# Patient Record
Sex: Male | Born: 1995 | Race: White | Hispanic: No | Marital: Single
Health system: Midwestern US, Academic
[De-identification: ages and names within clinical notes are randomized; demographics above are authoritative.]

## PROBLEM LIST (undated history)

## (undated) DIAGNOSIS — F419 Anxiety disorder, unspecified: Secondary | ICD-10-CM

## (undated) HISTORY — DX: Anxiety disorder, unspecified: F41.9

---

## 2015-02-14 ENCOUNTER — Other Ambulatory Visit: Admit: 2015-02-14

## 2015-02-14 DIAGNOSIS — Z029 Encounter for administrative examinations, unspecified: Secondary | ICD-10-CM

## 2015-02-14 LAB — STUDENTHS HEMOGRAM AND 3 PART DIFF
Hematocrit: 50.3 % (ref 36.0–49.0)
Hemoglobin: 16.3 g/dL (ref 12.0–16.9)
Lymphocytes Absolute: 1399.2 /uL (ref 1200.0–5200.0)
Lymphocytes Relative: 26.4 % (ref 15.0–45.0)
MCH: 29.3 pg (ref 25.0–35.0)
MCHC: 32.5 g/dL (ref 31.0–36.0)
MCV: 90.2 fL (ref 78.0–98.0)
MPV: 8.7 fL (ref 7.5–11.5)
Monocytes Absolute: 217.3 /uL (ref 200.0–900.0)
Monocytes Relative: 4.1 % (ref 0.0–12.0)
Neutrophils Absolute: 3683.5 /uL (ref 1800.0–8000.0)
Neutrophils Relative: 69.5 % (ref 40.0–80.0)
Platelets: 200 10*3/uL (ref 140–400)
RBC: 5.58 10*6/uL (ref 4.10–5.70)
RDW: 12.5 % (ref 11.0–15.0)
WBC: 5.3 10*3/uL (ref 4.5–13.0)

## 2015-02-14 LAB — CK: Total CK: 147 U/L (ref 30–223)

## 2015-02-14 LAB — MYOGLOBIN, SERUM: Myoglobin: 22.2 ng/mL (ref 17.4–105.7)

## 2015-02-14 LAB — SICKLE CELL SCREEN RFX TO HGBE: Sickle Cell Screen: NEGATIVE

## 2015-02-14 LAB — CREATININE, SERUM
Creatinine: 1.11 mg/dL (ref 0.60–1.30)
eGFR AA CKD-EPI: >90 See note.
eGFR NONAA CKD-EPI: >90 See note.

## 2015-02-14 LAB — LACTATE DEHYDROGENASE: LD: 167 U/L (ref 110–270)

## 2015-04-17 ENCOUNTER — Ambulatory Visit: Admit: 2015-04-17 | Payer: PRIVATE HEALTH INSURANCE

## 2015-04-17 DIAGNOSIS — Z029 Encounter for administrative examinations, unspecified: Secondary | ICD-10-CM

## 2015-04-17 LAB — CREATININE, SERUM
Creatinine: 1.03 mg/dL (ref 0.60–1.30)
eGFR AA CKD-EPI: >90 See note.
eGFR NONAA CKD-EPI: >90 See note.

## 2015-04-17 LAB — MYOGLOBIN, SERUM: Myoglobin: 21.4 ng/mL (ref 17.4–105.7)

## 2015-04-17 LAB — CK: Total CK: 153 U/L (ref 30–223)

## 2015-04-17 LAB — TESTOSTERONE: Testosterone: 481 ng/dL (ref 175–781)

## 2015-04-17 LAB — CORTISOL: Cortisol (Immunoassay): 9.6 ug/dL

## 2015-04-17 LAB — ESTROGENS, TOTAL: Estrogen: 84 pg/mL (ref 40–115)

## 2015-04-17 LAB — LACTATE DEHYDROGENASE: LD: 202 U/L (ref 110–270)

## 2015-04-17 LAB — DHEA, UNCONJUGATED: DHEA: 150 ng/dL (ref 40–491)

## 2015-04-22 ENCOUNTER — Ambulatory Visit: Admit: 2015-04-23 | Payer: PRIVATE HEALTH INSURANCE

## 2015-04-22 DIAGNOSIS — Z029 Encounter for administrative examinations, unspecified: Secondary | ICD-10-CM

## 2015-04-23 LAB — CREATININE, SERUM
Creatinine: 1.31 mg/dL — ABNORMAL HIGH (ref 0.60–1.30)
eGFR AA CKD-EPI: >90 See note.
eGFR NONAA CKD-EPI: 79 See note.

## 2015-04-23 LAB — LACTATE DEHYDROGENASE: LD: 317 U/L — ABNORMAL HIGH (ref 110–270)

## 2015-04-23 LAB — MYOGLOBIN, SERUM: Myoglobin: 160.2 ng/mL — ABNORMAL HIGH (ref 17.4–105.7)

## 2015-04-23 LAB — CK: Total CK: 882 U/L (ref 30–223)

## 2015-04-28 ENCOUNTER — Ambulatory Visit: Admit: 2015-04-29 | Payer: PRIVATE HEALTH INSURANCE

## 2015-04-28 DIAGNOSIS — Z029 Encounter for administrative examinations, unspecified: Secondary | ICD-10-CM

## 2015-04-28 LAB — CREATININE, SERUM
Creatinine: 1.2 mg/dL (ref 0.60–1.30)
eGFR AA CKD-EPI: >90 See note.
eGFR NONAA CKD-EPI: 88 See note.

## 2015-04-28 LAB — DHEA, UNCONJUGATED: DHEA: 165 ng/dL (ref 40–491)

## 2015-04-28 LAB — CORTISOL: Cortisol: 13 ug/dL

## 2015-04-28 LAB — MYOGLOBIN, SERUM: Myoglobin: 186.7 ng/mL (ref 17.4–105.7)

## 2015-04-28 LAB — CK: Total CK: 1296 U/L (ref 30–223)

## 2015-04-28 LAB — ESTROGENS, TOTAL: Estrogen: 90 pg/mL (ref 40–115)

## 2015-04-28 LAB — LACTATE DEHYDROGENASE: LD: 334 U/L (ref 110–270)

## 2015-04-28 LAB — TESTOSTERONE: Testosterone: 108 ng/dL (ref 175–781)

## 2015-05-05 ENCOUNTER — Ambulatory Visit: Admit: 2015-05-05 | Payer: PRIVATE HEALTH INSURANCE

## 2015-05-05 DIAGNOSIS — Z029 Encounter for administrative examinations, unspecified: Secondary | ICD-10-CM

## 2015-05-05 LAB — DHEA, UNCONJUGATED: DHEA: 270 ng/dL (ref 40–491)

## 2015-05-05 LAB — MYOGLOBIN, SERUM: Myoglobin: 101.7 ng/mL (ref 17.4–105.7)

## 2015-05-05 LAB — LACTATE DEHYDROGENASE: LD: 266 U/L (ref 110–270)

## 2015-05-05 LAB — CK: Total CK: 465 U/L (ref 30–223)

## 2015-05-05 LAB — TESTOSTERONE: Testosterone: 247 ng/dL (ref 175–781)

## 2015-05-05 LAB — CREATININE, SERUM
Creatinine: 1.24 mg/dL (ref 0.60–1.30)
eGFR AA CKD-EPI: >90 See note.
eGFR NONAA CKD-EPI: 84 See note.

## 2015-05-05 LAB — CORTISOL: Cortisol: 15.3 ug/dL

## 2015-05-05 LAB — ESTROGENS, TOTAL: Estrogen: 118 pg/mL (ref 40–115)

## 2015-06-27 ENCOUNTER — Other Ambulatory Visit: Admit: 2015-06-27 | Payer: PRIVATE HEALTH INSURANCE

## 2015-06-27 DIAGNOSIS — J029 Acute pharyngitis, unspecified: Secondary | ICD-10-CM

## 2015-06-27 LAB — THROAT CULTURE: Culture Result: NORMAL

## 2015-06-27 LAB — EBV ANTIBODY IGM
EBV IGM NUM: 10 U/mL (ref 0.00–35.99)
EBV VCA IgM: NEGATIVE

## 2015-06-27 LAB — EPSTEIN-BARR VIRUS VCA IGG AB
EBV IGG NUM: 52.9 U/mL (ref 0.00–17.99)
EBV VCA IgG: POSITIVE

## 2015-06-27 LAB — STUDENTHS RAPID STREP A CULT IF NEG: Rapid Strep A Screen: NEGATIVE

## 2015-07-05 ENCOUNTER — Other Ambulatory Visit: Admit: 2015-07-05 | Payer: PRIVATE HEALTH INSURANCE

## 2015-07-05 DIAGNOSIS — Z029 Encounter for administrative examinations, unspecified: Secondary | ICD-10-CM

## 2015-07-05 LAB — LACTATE DEHYDROGENASE: LD: 207 U/L (ref 110–270)

## 2015-07-05 LAB — TESTOSTERONE: Testosterone: 290 ng/dL (ref 175–781)

## 2015-07-05 LAB — CK: Total CK: 325 U/L — ABNORMAL HIGH (ref 30–223)

## 2015-07-05 LAB — CORTISOL: Cortisol: 6.4 ug/dL

## 2015-07-05 LAB — ESTROGENS, TOTAL: Estrogen: 82 pg/mL (ref 40–115)

## 2015-07-05 LAB — MYOGLOBIN, SERUM: Myoglobin: 50.5 ng/mL (ref 17.4–105.7)

## 2015-07-05 LAB — CREATININE, SERUM
Creatinine: 1.16 mg/dL (ref 0.60–1.30)
eGFR AA CKD-EPI: >90 See note.
eGFR NONAA CKD-EPI: >90 See note.

## 2015-07-05 LAB — DHEA, UNCONJUGATED: DHEA: 184 ng/dL (ref 40–491)

## 2015-12-05 ENCOUNTER — Inpatient Hospital Stay: Admit: 2015-12-05 | Payer: PRIVATE HEALTH INSURANCE

## 2015-12-05 DIAGNOSIS — M898X7 Other specified disorders of bone, ankle and foot: Secondary | ICD-10-CM

## 2015-12-10 ENCOUNTER — Ambulatory Visit: Admit: 2015-12-10 | Payer: PRIVATE HEALTH INSURANCE | Attending: Sports Medicine

## 2015-12-10 ENCOUNTER — Other Ambulatory Visit: Admit: 2015-12-10 | Payer: PRIVATE HEALTH INSURANCE

## 2015-12-10 DIAGNOSIS — S060X1A Concussion with loss of consciousness of 30 minutes or less, initial encounter: Secondary | ICD-10-CM

## 2015-12-10 DIAGNOSIS — Z029 Encounter for administrative examinations, unspecified: Secondary | ICD-10-CM

## 2015-12-10 LAB — CORTISOL: Cortisol: 6.9 ug/dL

## 2015-12-10 NOTE — Unmapped (Signed)
Per Trina Ao:    12/09/15 Lee Snethen    Met in training room at 11:00 AM.  [employer] and [employer] in attendance.     About noon on 12/08/15 (near he end of practice) Was making tackle and  hit back of head on turf. Remembers going fast into the play, didn't  remember play specifics until saw it on film. Reports after watching  scrimmage on tape, he remembered everything.     Received red glasses from [employer]. Reports relief with  glasses, mainly on the ride over here. Chose to wear during our visit  when prompted.     Reports after leaving yesterday, went home, laid down, and hydrated.  Claims did not have appetite but was able to eat chicken and potatoes  last night, pancake this morning. Claims appetite is still neutral  now.   Note, after exam claimed appetite returned - was hungry.     Did the Omega wave in the training room before seeing [place]. Will repeat  Omega wave as well as float today or tomorrow prn [place] [employer] and Nadine Counts.     Reports neck is sore on side of impact. Has not received neck therapy  today.  identified point tenderness right side posterior. had right  head tilt during dynavision today.     Reports has watched the film of scrimmage on the iPad without issue;  when looking on phone for a minute noticed his head a  return/exacerbate.     He has class 12:20 to 2. Monday; see recommendations for tomorrow.    Exam Action Plan, post QnA Dynavision, Retilab, VEP with color and  nystagomagraphy.       Dynavision   A* 80 hits per minute     Rx 0.36 seconds.   .28 to 0.39  39% slower   .26 to 0.39  50% slower  Substantially slowed peripheral vision reaction time.       Mem1 100 hits, no errors   Mem2 89 hits, no errors. Head tilt to right. Reports it makes his  head hurt more, though is manageable   Mem3 82 hits; no errors  This is a substantial fall in HPM, consistent with difficulties  multitasking and engaging complex executive function.     Retilab   Baseline below   B/w   R 67.5...102.2...19.4   L  71.6...108.6...19.3   Small   R 82.2...105.1...20.2   L 83.4...114.5.Marland KitchenMarland Kitchen19.0   End baseline     12/09/15 exam   B/w   R 75.1...103.3.Marland KitchenMarland Kitchen25.6   L 76.9.Marland KitchenMarland Kitchen103.9.Marland KitchenMarland Kitchen24.6   Small   R 84.5...114.5.Marland KitchenMarland Kitchen30.7   L 84.5...115.7....23.8     Green   R 70.5.Marland KitchenMarland Kitchen104.5.Marland KitchenMarland Kitchen20.5   L 78.7.Marland KitchenMarland Kitchen103.9.Marland KitchenMarland Kitchen22.8   Small   R 84.0...111.5...23.2   L 82.8...110.4.Marland KitchenMarland Kitchen24.5     Blue   R 77.5...111.5.Marland KitchenMarland Kitchen19.5   L 78.7...108.0.Marland KitchenMarland Kitchen18.0   Small   R 91.6...120.4.Marland KitchenMarland Kitchen8.23   L 96.3...125.0...11.1     Red   R 76.9...112.1...17.8   L 75.7...106.8.Marland KitchenMarland Kitchen20.9   Small   R 88.6...117.7.Marland KitchenMarland Kitchen14.9   L 86.3...114.5...18.4    increased voltages, highest voltages with color green; consistent with  red glasses relief.     Nyst   R 144.6   L 132.7   Ratio 109%    Baseline Nystagmus  R. 95.32  L. 105.00  End Baseline    substantially slowed nystagmography results.    Pupils perrla - with photophobia/photosensitivity.    -blue...felt brighter than red...no    -green & red about the same...likes  red more      Dynavision  ???head tilted about 20 degrees to the right  ???Delayed  or slowed performance       Recommendations   Seeing [place] at 10:30 tomorrow morning for return to learn.    Follow nystagmography and retilab results concerning return to learn.     Eat well balanced diet. Even if not feeling hungry, try to eat some things     Drink plenty of water     Keep media to a minimum. Only 1 hour up close. TV okay if greater than 40ft.    Work with Archivist for neck, float, omega wave, Halo prn.    Notify appropriate personnel concerning academics.    give consideration to assessing phorias, OCT and saccades for return to learn.

## 2015-12-10 NOTE — Unmapped (Signed)
Chief Complaint: New Head injury    HEAD INJURY HPI:   PCP: Lilyan Punt, MD  Date of Injury: 12/08/15  School/Sport:[employer]   He is a 20 y.o. male who is here for a new head injury.    He does have dazed and confused amnesia, with LOC of a few seconds  Current PCSS Symptom Total = 11 - not quantified    PMH:  Previous head injuries suffered are 1 occuring in middle school. Patient was diagnosed with none prior to the injury.  and has the following habits contributing to prolonged concussion recovery:  no contributing habits and family history unknown    History reviewed. No pertinent past medical history.  No current outpatient prescriptions on file.     No current facility-administered medications for this visit.     No Known Allergies  History reviewed. No pertinent family history.    Review OF Systems  HEENT: Denies recent epistaxis or sore throat.   CV: Denies any recent peripheral edema,   RESP: Denies any recent cough, hemoptysis, dyspnea, DOE, or change in exercise tolerance.   GI: Denies any recent nausea, vomiting, diarrhea, constipation, indigestion or dysphagia.   SKIN: Denies any new nodules, rash, itching, or hives.   GU: Denies any recent hematuria,   MS: Denies any new or other areas of myalgias, joint stiffness or joint swelling.   PSYCH: Denies any recent abnormal anxiety or depression.   NEURO:  Denies any recent changes in localized sensation, strength or coordination  ENDO:  Denies heat/cold intolerances, or polydipsia.    EXAM  General Appearance: well appearing  Psych: Alert, cooperative, no distress, appears stated age; Engaging, makes eye contact, appropriate affect & behavior  Head: Normocephalic  without obvious abnormality,      Eyes: Appropriate pupil response to light bilaterally; conjunctiva/corneas clear, EOM's intact, convergence is normal; saccadic motion is slow to the right  no anti-saccades;  no phorias  Pain with left - right movement - slight rolling vertical nystabmus  Ears:   Normal canals, TM and no w/d to tunning fork test  Neck: Supple, symmetrical, trachea midline, no adenopathy;  thyroid:No enlargement/tenderness/nodules;  Cervical Spine: No midline tenderness, spasms and - Spurling's test   + bilateral A-O tendereness  Lungs: Normal breath sounds without extra, abnormal sounds    Heart: Regular rate and rhythm, normal cardio-inspiratory reflex   Extremities Extremities normal, atraumatic, moves all in coordinated manner. No worsening of headache with isometric contractions  Skin: Skin color, texture, turgor normal, no rashes or lesions   Neurologic: CNII-XII intact.  Normal strength, sensation and reflexes   Coordinated upper extremity gross motions are normal.   Fine motor coordination appears symmetrical and right (dominant over left on several hand approximations)  Balance:  >3 errors on single- leg and tandem stance.   Computerized Neuropsych testing not done    Assessment:    He is a 20 y.o. male with recent concussion - had LOC symptom load of #17- decreased ot #11   Previous head injuries: 1 occuring in middle school.     Plan  RTP Stage: can progress to low intensity activity  Meds: ibuprofen tid  Referrals: none  F/U: 3-days        Federico Flake, M.D.

## 2015-12-14 NOTE — Unmapped (Signed)
Per Trina Ao    December 12 2015; Ibraheem Pigue in [facility] training room.     Claims he Feels better.  Classes are going good.  Does exercises with [place] [employer] or Marcelino Duster   Wears glasses. Not on now. Squinting. Claims he wears them when exercising and when outside.   Gaylyn Rong is main co. Still Feels slow.    Dynavision   84 a star.  Rx test. Not remembering instructions. .32  .31.  .31  .33.  .28    Mem 3.   90.    4 exo at 20 feet.  0 vertical phobia.  Far  Near 4 exo     Balance good.  Possly small pronator drift.    Sw Nadine Counts and [place].     Plan.  Check phoria baselines. Compare to ours.  Stay the course with progression.  Cardio low impact.  Dynavision rehab. Multi tasking, frontal lobe.   See again Friday or sooner prn.  Re return to learn, continue as before. Next test 1.5 weeks. Consider added time.

## 2015-12-15 MED ORDER — methylPREDNISolone (MEDROL, PAK,) 4 mg tablet
4 | ORAL_TABLET | ORAL | Status: AC
Start: 2015-12-15 — End: 2016-05-11

## 2015-12-18 ENCOUNTER — Other Ambulatory Visit: Admit: 2015-12-18 | Payer: PRIVATE HEALTH INSURANCE

## 2015-12-18 DIAGNOSIS — S060X1D Concussion with loss of consciousness of 30 minutes or less, subsequent encounter: Secondary | ICD-10-CM

## 2015-12-18 LAB — CORTISOL
Cortisol: 5.2 ug/dL
Cortisol: 10.6 ug/dL

## 2015-12-19 NOTE — Unmapped (Signed)
Per Trina Ao    Gryffin Conaty Fu exam. April 3. 2017.     Progressing. Claims he feels normal. Stay the course. Continue exertion cardio with trainers. Continue classes.  Work progression and monitor sx.   Fu with Dr. Ellin Goodie and training staff. Give consideration to using extra time on next exam. Report if time needed or if Sx Occurred. Verbal report given to [facility].

## 2016-03-26 ENCOUNTER — Other Ambulatory Visit: Admit: 2016-03-26 | Payer: PRIVATE HEALTH INSURANCE

## 2016-03-26 DIAGNOSIS — R5383 Other fatigue: Secondary | ICD-10-CM

## 2016-03-26 LAB — CBC
Hematocrit: 44.6 % (ref 38.5–50.0)
Hemoglobin: 15.2 g/dL (ref 13.2–17.1)
MCH: 30.7 pg (ref 27.0–33.0)
MCHC: 33.9 g/dL (ref 32.0–36.0)
MCV: 90.3 fL (ref 80.0–100.0)
MPV: 8.9 fL (ref 7.5–11.5)
Platelets: 210 10*3/uL (ref 140–400)
RBC: 4.94 10*6/uL (ref 4.20–5.80)
RDW: 13.1 % (ref 11.0–15.0)
WBC: 7.2 10*3/uL (ref 3.8–10.8)

## 2016-03-26 LAB — COMPREHENSIVE METABOLIC PANEL
ALT: 32 U/L (ref 7–52)
AST: 38 U/L (ref 13–39)
Albumin: 4.5 g/dL (ref 3.5–5.7)
Alkaline Phosphatase: 73 U/L (ref 36–125)
Anion Gap: 10 mmol/L (ref 3–16)
BUN: 25 mg/dL (ref 7–25)
CO2: 28 mmol/L (ref 21–33)
Calcium: 9.2 mg/dL (ref 8.6–10.3)
Chloride: 102 mmol/L (ref 98–110)
Creatinine: 1.31 mg/dL (ref 0.60–1.30)
Glucose: 120 mg/dL (ref 70–100)
Osmolality, Calculated: 296 mOsm/kg (ref 278–305)
Potassium: 4 mmol/L (ref 3.5–5.3)
Sodium: 140 mmol/L (ref 133–146)
Total Bilirubin: 0.6 mg/dL (ref 0.0–1.5)
Total Protein: 7 g/dL (ref 6.4–8.9)
eGFR AA CKD-EPI: >90 See note.
eGFR NONAA CKD-EPI: 78 See note.

## 2016-03-26 LAB — CORTISOL: Cortisol: 10.5 ug/dL

## 2016-03-26 LAB — VITAMIN D 25 HYDROXY: Vit D, 25-Hydroxy: 52.8 ng/mL (ref 30.0–100)

## 2016-03-26 LAB — THYROID FUNCTION CASCADE: TSH: 1.72 u[IU]/mL (ref 0.34–5.60)

## 2016-03-26 LAB — TESTOSTERONE: Testosterone: 267 ng/dL (ref 175–781)

## 2016-03-28 ENCOUNTER — Other Ambulatory Visit: Admit: 2016-03-28 | Payer: PRIVATE HEALTH INSURANCE

## 2016-03-28 DIAGNOSIS — Z029 Encounter for administrative examinations, unspecified: Secondary | ICD-10-CM

## 2016-03-28 LAB — TESTOSTERONE: Testosterone: 192 ng/dL (ref 175–781)

## 2016-03-28 LAB — IRON STUDIES
% Iron Saturation: 30.2 % (ref 15.0–55.0)
Iron: 125 ug/dL (ref 50–212)
TIBC: 414 ug/dL (ref 261–462)

## 2016-03-28 LAB — CREATININE, SERUM
Creatinine: 1.37 mg/dL (ref 0.60–1.30)
eGFR AA CKD-EPI: 86 See note.
eGFR NONAA CKD-EPI: 74 See note.

## 2016-03-28 LAB — FERRITIN: Ferritin: 65.2 ng/mL (ref 23.9–336.2)

## 2016-03-28 LAB — LACTATE DEHYDROGENASE: LD: 237 U/L (ref 110–270)

## 2016-03-28 LAB — ESTROGENS, TOTAL: Estrogen: 65 pg/mL (ref 40–115)

## 2016-03-28 LAB — CK: Total CK: 702 U/L (ref 30–223)

## 2016-03-28 LAB — MYOGLOBIN, SERUM: Myoglobin: 129.8 ng/mL (ref 17.4–105.7)

## 2016-03-28 LAB — CORTISOL: Cortisol: 20.8 ug/dL

## 2016-03-28 LAB — DHEA, UNCONJUGATED: DHEA: 208 ng/dL (ref 40–491)

## 2016-04-10 ENCOUNTER — Other Ambulatory Visit: Admit: 2016-04-10 | Payer: PRIVATE HEALTH INSURANCE

## 2016-04-10 DIAGNOSIS — Z029 Encounter for administrative examinations, unspecified: Secondary | ICD-10-CM

## 2016-04-10 LAB — CREATININE, SERUM
Creatinine: 1.08 mg/dL (ref 0.60–1.30)
eGFR AA CKD-EPI: >90 See note.
eGFR NONAA CKD-EPI: >90 See note.

## 2016-04-10 LAB — CORTISOL: Cortisol: 21.1 ug/dL

## 2016-04-10 LAB — TESTOSTERONE: Testosterone: 451 ng/dL (ref 175–781)

## 2016-04-10 LAB — IRON STUDIES
% Iron Saturation: 22.9 % (ref 15.0–55.0)
Iron: 92 ug/dL (ref 50–212)
TIBC: 402 ug/dL (ref 261–462)

## 2016-04-10 LAB — FERRITIN: Ferritin: 56.2 ng/mL (ref 23.9–336.2)

## 2016-04-10 LAB — LACTATE DEHYDROGENASE: LD: 258 U/L (ref 110–270)

## 2016-04-10 LAB — CK: Total CK: 401 U/L (ref 30–223)

## 2016-04-10 LAB — ESTROGENS, TOTAL: Estrogen: 105 pg/mL (ref 40–115)

## 2016-04-10 LAB — DHEA, UNCONJUGATED: DHEA: 413 ng/dL (ref 40–491)

## 2016-04-10 LAB — MYOGLOBIN, SERUM: Myoglobin: 38.4 ng/mL (ref 17.4–105.7)

## 2016-04-16 ENCOUNTER — Other Ambulatory Visit: Admit: 2016-04-16 | Payer: PRIVATE HEALTH INSURANCE

## 2016-04-16 DIAGNOSIS — Z029 Encounter for administrative examinations, unspecified: Secondary | ICD-10-CM

## 2016-04-16 LAB — CREATININE, SERUM
Creatinine: 1.26 mg/dL (ref 0.60–1.30)
eGFR AA CKD-EPI: >90 See note.
eGFR NONAA CKD-EPI: 82 See note.

## 2016-04-16 LAB — TESTOSTERONE: Testosterone: 485 ng/dL (ref 175–781)

## 2016-04-16 LAB — LACTATE DEHYDROGENASE: LD: 276 U/L (ref 110–270)

## 2016-04-16 LAB — MYOGLOBIN, SERUM: Myoglobin: 93.2 ng/mL (ref 17.4–105.7)

## 2016-04-16 LAB — IRON STUDIES
% Iron Saturation: 26.5 % (ref 15.0–55.0)
Iron: 105 ug/dL (ref 50–212)
TIBC: 396 ug/dL (ref 261–462)

## 2016-04-16 LAB — CORTISOL: Cortisol: 8 ug/dL

## 2016-04-16 LAB — CK: Total CK: 705 U/L (ref 30–223)

## 2016-04-16 LAB — FERRITIN: Ferritin: 53.8 ng/mL (ref 23.9–336.2)

## 2016-04-16 LAB — DHEA, UNCONJUGATED: DHEA: 152 ng/dL (ref 40–491)

## 2016-04-16 LAB — ESTROGENS, TOTAL: Estrogen: 114 pg/mL (ref 40–115)

## 2016-04-23 ENCOUNTER — Ambulatory Visit: Admit: 2016-04-23 | Payer: PRIVATE HEALTH INSURANCE

## 2016-04-23 DIAGNOSIS — Z029 Encounter for administrative examinations, unspecified: Secondary | ICD-10-CM

## 2016-04-23 LAB — CREATININE, SERUM
Creatinine: 1.18 mg/dL (ref 0.60–1.30)
eGFR AA CKD-EPI: >90 See note.
eGFR NONAA CKD-EPI: 89 See note.

## 2016-04-23 LAB — CK: Total CK: 773 U/L — ABNORMAL HIGH (ref 30–223)

## 2016-04-23 LAB — CORTISOL: Cortisol (Immunoassay): 19.2 ug/dL

## 2016-04-23 LAB — LACTATE DEHYDROGENASE: LD: 259 U/L (ref 110–270)

## 2016-04-23 LAB — IRON STUDIES
% Iron Saturation: 27.9 % (ref 15.0–55.0)
Iron: 105 ug/dL (ref 50–212)
TIBC: 377 ug/dL (ref 261–462)

## 2016-04-23 LAB — DHEA, UNCONJUGATED: DHEA: 344 ng/dL (ref 40–491)

## 2016-04-23 LAB — MYOGLOBIN, SERUM: Myoglobin: 44 ng/mL (ref 17.4–105.7)

## 2016-04-23 LAB — ESTROGENS, TOTAL: Estrogen: 122 pg/mL (ref 40–115)

## 2016-04-23 LAB — TESTOSTERONE: Testosterone: 400 ng/dL (ref 175–781)

## 2016-04-23 LAB — FERRITIN: Ferritin: 55.4 ng/mL (ref 23.9–336.2)

## 2016-04-29 ENCOUNTER — Other Ambulatory Visit: Admit: 2016-04-30 | Payer: PRIVATE HEALTH INSURANCE

## 2016-04-29 DIAGNOSIS — Z029 Encounter for administrative examinations, unspecified: Secondary | ICD-10-CM

## 2016-04-30 LAB — CREATININE, SERUM
Creatinine: 1.25 mg/dL (ref 0.60–1.30)
eGFR AA CKD-EPI: >90 See note.
eGFR NONAA CKD-EPI: 83 See note.

## 2016-04-30 LAB — ESTROGENS, TOTAL: Estrogen: 126 pg/mL — ABNORMAL HIGH (ref 40–115)

## 2016-04-30 LAB — CK: Total CK: 671 U/L (ref 30–223)

## 2016-04-30 LAB — FERRITIN: Ferritin: 66.7 ng/mL (ref 23.9–336.2)

## 2016-04-30 LAB — DHEA, UNCONJUGATED: DHEA: 146 ng/dL (ref 40–491)

## 2016-04-30 LAB — MYOGLOBIN, SERUM: Myoglobin: 79.4 ng/mL (ref 17.4–105.7)

## 2016-04-30 LAB — LACTATE DEHYDROGENASE: LD: 278 U/L (ref 110–270)

## 2016-04-30 LAB — IRON STUDIES
% Iron Saturation: 27.2 % (ref 15.0–55.0)
Iron: 105 ug/dL (ref 50–212)
TIBC: 386 ug/dL (ref 261–462)

## 2016-04-30 LAB — TESTOSTERONE: Testosterone: 257 ng/dL (ref 175–781)

## 2016-04-30 LAB — CORTISOL: Cortisol: 6.2 ug/dL

## 2016-05-04 ENCOUNTER — Ambulatory Visit: Admit: 2016-05-04 | Payer: PRIVATE HEALTH INSURANCE

## 2016-05-04 DIAGNOSIS — Z029 Encounter for administrative examinations, unspecified: Secondary | ICD-10-CM

## 2016-05-04 LAB — LACTATE DEHYDROGENASE: LD: 263 U/L (ref 110–270)

## 2016-05-04 LAB — TESTOSTERONE: Testosterone: 387 ng/dL (ref 175–781)

## 2016-05-04 LAB — CREATININE, SERUM
Creatinine: 1.04 mg/dL (ref 0.60–1.30)
eGFR AA CKD-EPI: >90 See note.
eGFR NONAA CKD-EPI: >90 See note.

## 2016-05-04 LAB — IRON STUDIES
% Iron Saturation: 17.1 % (ref 15.0–55.0)
Iron: 66 ug/dL (ref 50–212)
TIBC: 385 ug/dL (ref 261–462)

## 2016-05-04 LAB — FERRITIN: Ferritin: 78.5 ng/mL (ref 23.9–336.2)

## 2016-05-04 LAB — CK: Total CK: 365 U/L (ref 30–223)

## 2016-05-04 LAB — DHEA, UNCONJUGATED: DHEA: 218 ng/dL (ref 40–491)

## 2016-05-04 LAB — CORTISOL: Cortisol: 14.3 ug/dL

## 2016-05-04 LAB — MYOGLOBIN, SERUM: Myoglobin: 47 ng/mL (ref 17.4–105.7)

## 2016-05-04 LAB — ESTROGENS, TOTAL: Estrogen: 119 pg/mL — ABNORMAL HIGH (ref 40–115)

## 2016-05-11 MED ORDER — clindamycin (CLEOCIN) 300 MG capsule
300 | ORAL_CAPSULE | Freq: Four times a day (QID) | ORAL | Status: AC
Start: 2016-05-11 — End: 2018-10-02

## 2016-05-11 NOTE — Unmapped (Signed)
Skin infection right thumb at IP joint    Clinicamycin, soak cover and I/D this week    [name] Kristie Bracewell

## 2017-07-26 MED ORDER — doxycycline (VIBRAMYCIN) 100 MG capsule
100 | ORAL_CAPSULE | Freq: Two times a day (BID) | ORAL | 0 refills | Status: AC
Start: 2017-07-26 — End: 2017-08-05

## 2017-07-26 NOTE — Unmapped (Signed)
HPI  Pt. Having new URi symptoms ofnasal congestion and low grade fever for > 7 days; Has a dry cough w/o chest pain or dyspnea. Mild headaches Difficulty sleeping due to nasal congestion     No history of reactive airway disease history    ROS: No significant GI symptoms or skin rash    Exam-  Fever < 100  ENT - ears, normal appearing TM and canals,           - nasal turbinate swelling          - red throat w/o exudate  Neck/Nodes - shotty nodes w/o significant tenderness  Chest- clear  Skin- no obvious rash or patterned rash    Imp-  Sinus infection > 10 days of symtpoms    Plan-  Antibiotics were  prescribed  Pseudophed/guaif 60/400 1-2 BID prn  Afrin nasal spray 2-3 x/daily prn for 3 days max  Ibuprofen or Tylenol prn  Increase fluid intake   OK to work-out provided no fever or symptoms below the neck    Eric Miranda Eric Miranda

## 2017-12-22 ENCOUNTER — Ambulatory Visit (INDEPENDENT_AMBULATORY_CARE_PROVIDER_SITE_OTHER): Payer: BLUE CROSS/BLUE SHIELD | Admitting: Family Medicine

## 2017-12-22 ENCOUNTER — Other Ambulatory Visit: Payer: Self-pay | Admitting: Family Medicine

## 2017-12-22 ENCOUNTER — Encounter: Payer: Self-pay | Admitting: Family Medicine

## 2017-12-22 VITALS — BP 152/63 | HR 73 | Temp 98.2°F | Resp 14

## 2017-12-22 DIAGNOSIS — F4323 Adjustment disorder with mixed anxiety and depressed mood: Secondary | ICD-10-CM

## 2017-12-22 NOTE — Progress Notes (Signed)
Patient presents today with symptoms of anxiety and depression. Patient states that he had symptoms related to anxiety prior to coming to [place]. These anxiety issues typically centered around football performance. He is a Psychologist, forensicjunior transfer student. His symptoms were associated with playing football at the [employer]. In December 2018 the patient decided to [place] [employer] [place] and come to [place]. He made this decision because he wasn't getting any significant playing time and putting in all the hard work. His dream has always been to play football at the [employer]. He states that his decision to come to [place] [facility] [employer] was quick and impulsive. He admits that he has had some adjustment issues with being in a smaller town and around a less diverse Proofreaderstudent body. He does enjoy the healthy football atmosphere at [place]. He does believe that he has put quite a bit of pressure on himself to perform better since he is a transfer from a bigger school. Recently the patient went home for Spring Break and saw a lot of his friends. He missed being in [place]. When he returned to [facility] he noticed himself becoming more introverted and not wanting to hang out with people here. He also has been thinking about the numerous credits he has to still do to graduate given that he transferred. He had one episode last week where he felt like he wanted to pack up his things and go back home. He denies having any crying spells. Patient states that he has other interests now besides football. He denies any suicidal or homicidal ideations. He denies any drug use. He has taken medication for anxiety in the past for short periods of time. He is currently not taking any medication. He admits to meeting with a counselor on-campus yesterday. He states that his appointment went well and has a follow-up appointment next week. He is interested in establishing with a psychiatrist in the area.  ROS: Negative except  mentioned above. Vitals as per Epic.  GENERAL: NAD, good eye contact  RESP: CTA B CARD: RRR NEURO: CN II-XII grossly intact   A/P: Adjustment disorder with anxiety and depression symptoms  - would recommend that patient continue to see counselor on campus, will also refer patient to Dr. Maryruth BunKapur (psychiatrist), if patient has a crisis he is to seek medical attention as discussed, patient addressed understanding of plan and was appreciative. He had no further questions.

## 2018-05-07 ENCOUNTER — Emergency Department: Payer: BLUE CROSS/BLUE SHIELD

## 2018-05-07 ENCOUNTER — Other Ambulatory Visit: Payer: Self-pay

## 2018-05-07 ENCOUNTER — Emergency Department
Admission: EM | Admit: 2018-05-07 | Discharge: 2018-05-07 | Disposition: A | Discharge status: Home / Self Care | Payer: BLUE CROSS/BLUE SHIELD | Attending: Emergency Medicine | Admitting: Emergency Medicine

## 2018-05-07 DIAGNOSIS — M25511 Pain in right shoulder: Secondary | ICD-10-CM | POA: Diagnosis not present

## 2018-05-07 DIAGNOSIS — R079 Chest pain, unspecified: Secondary | ICD-10-CM

## 2018-05-07 DIAGNOSIS — N289 Disorder of kidney and ureter, unspecified: Secondary | ICD-10-CM

## 2018-05-07 DIAGNOSIS — N189 Chronic kidney disease, unspecified: Secondary | ICD-10-CM | POA: Insufficient documentation

## 2018-05-07 LAB — CBC
HEMATOCRIT: 45 % (ref 40.0–52.0)
HEMOGLOBIN: 16 g/dL (ref 13.0–18.0)
MCH: 31.5 pg (ref 26.0–34.0)
MCHC: 35.5 g/dL (ref 32.0–36.0)
MCV: 88.8 fL (ref 80.0–100.0)
Platelets: 193 10*3/uL (ref 150–440)
RBC: 5.07 MIL/uL (ref 4.40–5.90)
RDW: 12.4 % (ref 11.5–14.5)
WBC: 8.2 10*3/uL (ref 3.8–10.6)

## 2018-05-07 LAB — BASIC METABOLIC PANEL
ANION GAP: 8 (ref 5–15)
BUN: 21 mg/dL — AB (ref 6–20)
CHLORIDE: 102 mmol/L (ref 98–111)
CO2: 27 mmol/L (ref 22–32)
Calcium: 9 mg/dL (ref 8.9–10.3)
Creatinine, Ser: 1.57 mg/dL — ABNORMAL HIGH (ref 0.61–1.24)
GFR calc Af Amer: >60 mL/min (ref >60)
GLUCOSE: 112 mg/dL — AB (ref 70–99)
POTASSIUM: 3.6 mmol/L (ref 3.5–5.1)
Sodium: 137 mmol/L (ref 135–145)

## 2018-05-07 LAB — TROPONIN I: Troponin I: <0.03 ng/mL (ref <0.03)

## 2018-05-07 LAB — FIBRIN DERIVATIVES D-DIMER (ARMC ONLY): Fibrin derivatives D-dimer (ARMC): 187.68 ng/mL (FEU) (ref 0.00–499.00)

## 2018-05-07 MED ORDER — IBUPROFEN 600 MG PO TABS
600.0000 mg | ORAL_TABLET | Freq: Once | ORAL | Status: AC
  Administered 2018-05-07: 600 mg via ORAL
  Filled 2018-05-07: qty 1

## 2018-05-07 MED ORDER — IBUPROFEN 800 MG PO TABS: 800.0000 mg | ORAL_TABLET | Freq: Three times a day (TID) | ORAL | 0 refills | Status: AC | PRN

## 2018-05-07 MED ORDER — SODIUM CHLORIDE 0.9 % IV BOLUS
1000.0000 mL | Freq: Once | INTRAVENOUS | Status: AC
  Administered 2018-05-07: 1000 mL via INTRAVENOUS

## 2018-05-07 MED ORDER — ACETAMINOPHEN 500 MG PO TABS
1000.0000 mg | ORAL_TABLET | Freq: Once | ORAL | Status: AC
  Administered 2018-05-07: 1000 mg via ORAL
  Filled 2018-05-07: qty 2

## 2018-05-07 NOTE — ED Notes (Signed)
Pt stated that he woke up this morning with right side pain when he woke up. Pt stated that he went and worked up and he become dizzy but none at present time. Pt denies any injury to site.

## 2018-05-07 NOTE — Discharge Instructions (Addendum)
Today your kidney function was slightly abnormal.  This is likely due to some mild dehydration so please drink plenty of fluid and have your primary care physician recheck your kidney function on Monday.  You may take Tylenol or Motrin for pain.  You may also use a heating pad for 15 minutes every 2 hours to decrease pain.    Return to the emergency department if you develop severe pain, lightheadedness or fainting, fever, or any other symptoms concerning to you.

## 2018-05-07 NOTE — ED Provider Notes (Signed)
[facility] Emergency Department Provider Note  ____________________________________________  Time seen: Approximately 11:24 PM  I have reviewed the triage vital signs and the nursing notes.   HISTORY  Chief Complaint Rib Pain    HPI Mario Stuart is a 22 y.o. male, otherwise healthy, presenting with right-sided chest pain.  The patient reports that this morning he awoke and had a pain over the right chest which radiated to the right shoulder.  He denies any recent trauma or new strain.  His pain was worse with deep breaths or exertion at football practice, and he felt short of breath at practice.  He has not had any lightheadedness or syncope, shortness of breath.  He has not tried anything for his pain.  Past Medical History:  Diagnosis Date  . Anxiety     There are no active problems to display for this patient.   History reviewed. No pertinent surgical history.  Current Outpatient Rx  . Order #: [id number] Class: Normal    Allergies Patient has no known allergies.  Family History  Problem Relation Age of Onset  . Hypertension Father     Social History Social History   Tobacco Use  . Smoking status: Never Smoker  . Smokeless tobacco: Former Engineer, water Use Topics  . Alcohol use: Yes  . Drug use: Not on file    Review of Systems Constitutional: No fever/chills.  No lightheadedness or syncope.  No trauma Eyes: No visual changes. ENT: No sore throat. No congestion or rhinorrhea. Cardiovascular: Positive chest pain. Denies palpitations. Respiratory: Positive shortness of breath.  No cough. Gastrointestinal: No abdominal pain.  No nausea, no vomiting.  No diarrhea.  No constipation. Genitourinary: Negative for dysuria. Musculoskeletal: Negative for back pain. Skin: Negative for rash. Neurological: Negative for headaches. No focal numbness, tingling or weakness.     ____________________________________________   PHYSICAL  EXAM:  VITAL SIGNS: ED Triage Vitals [05/07/18 1802]  Enc Vitals Group     BP 121/81     Pulse Rate (!) 106     Resp (!) 22     Temp 99.6 F (37.6 C)     Temp Source Oral     SpO2 100 %     Weight 222 lb (100.7 kg)     Height 6\' 2"  (1.88 m)     Head Circumference      Peak Flow      Pain Score 7     Pain Loc      Pain Edu?      Excl. in GC?     Constitutional: Alert and oriented. Answers questions appropriately. Eyes: Conjunctivae are normal.  EOMI. No scleral icterus. Head: Atraumatic. Nose: No congestion/rhinnorhea. Mouth/Throat: Mucous membranes are moist.  Neck: No stridor.  Supple.  No JVD.  No meningismus. CHEST: Patient has no evidence of skin abnormalities including ecchymosis or swelling, erythema, on the chest wall.  He has no crepitus.  No palpable rib instability. Cardiovascular: Normal rate, regular rhythm. No murmurs, rubs or gallops.  Respiratory: Normal respiratory effort.  No accessory muscle use or retractions. Lungs CTAB.  No wheezes, rales or ronchi. Gastrointestinal: Soft, nontender and nondistended.  No guarding or rebound.  No peritoneal signs. Musculoskeletal: Full range of motion of the right wrist, elbow and shoulder without difficulty.  Normal right radial pulse.  No LE edema. No ttp in the calves or palpable cords.  Negative Homan's sign. Neurologic:  A&Ox3.  Speech is clear.  Face and smile are symmetric.  EOMI.  Moves all extremities well. Skin:  Skin is warm, dry and intact. No rash noted. Psychiatric: Mood and affect are normal. Speech and behavior are normal.  Normal judgement.  ____________________________________________   LABS (all labs ordered are listed, but only abnormal results are displayed)  Labs Reviewed  BASIC METABOLIC PANEL - Abnormal; Notable for the following components:      Result Value   Glucose, Bld 112 (*)    BUN 21 (*)    Creatinine, Ser 1.57 (*)    All other components within normal limits  CBC  TROPONIN I   FIBRIN DERIVATIVES D-DIMER ([facility] ONLY)   ____________________________________________  EKG  ED ECG REPORT I, Anne-Caroline Sharma Covert, the attending physician, personally viewed and interpreted this ECG.   Date: 05/07/2018  EKG Time: 1802  Rate: 108  Rhythm: sinus tachycardia  Axis: normal  Intervals:none  ST&T Change: No STEMI; no evidence of Brugada syndrome, hypertrophy, or prolonged QTC.  ____________________________________________  RADIOLOGY  Dg Chest 2 View  Result Date: 05/07/2018 CLINICAL DATA:  22 y/o M; L woke with pain from the right ribs through the right shoulder. EXAM: CHEST - 2 VIEW COMPARISON:  None. FINDINGS: The heart size and mediastinal contours are within normal limits. Both lungs are clear. The visualized skeletal structures are unremarkable. IMPRESSION: No active cardiopulmonary disease. Electronically Signed   By: Mitzi Hansen M.D.   On: 05/07/2018 18:27    ____________________________________________   PROCEDURES  Procedure(s) performed: None  Procedures  Critical Care performed: No ____________________________________________   INITIAL IMPRESSION / ASSESSMENT AND PLAN / ED COURSE  Pertinent labs & imaging results that were available during my care of the patient were reviewed by me and considered in my medical decision making (see chart for details).  22 y.o. male, otherwise healthy, presenting with right-sided chest pain that is pleuritic and radiates to the right shoulder.  Overall, I do not see any evidence of trauma and he has no history of this.  He also is not giving any history that would suppose a infectious process such as pneumonia.  He has no evidence of DVT, no family or personal history of blood clots, no tachycardia no hypoxia so he is very low risk for PE.  However, he is describing pleuritic chest pain so we have had a long discussion with the patient and both of his parents regarding the use of d-dimer versus CT  imaging.  They have opted to get a d-dimer and if negative, the patient will be discharged home with instructions for symptom medic treatment with Tylenol or Motrin, as well as heat therapy, and follow-up on Monday with his primary care physician.  ----------------------------------------- 11:29 PM on 05/07/2018 -----------------------------------------  The patient does have some mild renal insufficiency today with a creatinine of 1.57.  He has received a liter of intravenous fluids and I have let him know about this result.  He will follow-up with his primary care physician on Monday and has been encouraged to drink plenty of fluid over the weekend.  The patient's d-dimer is negative and he will be discharged at this time.  ____________________________________________  FINAL CLINICAL IMPRESSION(S) / ED DIAGNOSES  Final diagnoses:  Renal insufficiency  Right-sided chest pain  Acute pain of right shoulder         NEW MEDICATIONS STARTED DURING THIS VISIT:  New Prescriptions   IBUPROFEN (ADVIL,MOTRIN) 800 MG TABLET    Take 1 tablet (800 mg total) by mouth every 8 (eight) hours as needed for  mild pain or moderate pain (with food).      Rockne MenghiniNorman, Anne-Caroline, MD 05/07/18 2329

## 2018-05-07 NOTE — ED Triage Notes (Signed)
Awoke with pain from right ribs up through right shoulder. No known injury. Pain with deep breathing.  Pt alert and oriented X4, active, cooperative, pt in NAD. RR even and unlabored, color WNL.

## 2018-05-10 ENCOUNTER — Other Ambulatory Visit: Payer: Self-pay | Admitting: Family Medicine

## 2018-05-10 ENCOUNTER — Other Ambulatory Visit (INDEPENDENT_AMBULATORY_CARE_PROVIDER_SITE_OTHER): Payer: BLUE CROSS/BLUE SHIELD | Admitting: Family Medicine

## 2018-05-10 ENCOUNTER — Other Ambulatory Visit: Payer: Self-pay

## 2018-05-10 VITALS — HR 78 | Resp 14

## 2018-05-10 DIAGNOSIS — R1011 Right upper quadrant pain: Secondary | ICD-10-CM

## 2018-05-10 DIAGNOSIS — J029 Acute pharyngitis, unspecified: Secondary | ICD-10-CM

## 2018-05-10 NOTE — Addendum Note (Signed)
Addended by: Berneice GandyBROWN, Leani Myron A on: 05/10/2018 12:43 PM   Modules accepted: Orders

## 2018-05-10 NOTE — Progress Notes (Signed)
Patient presents today after being seen in the ER for right upper quadrant abdominal pain radiating to the right shoulder. The etiology of his symptoms was not discovered in the ER but a d-dimer was negative and a chest x-ray was read as normal. Patient's creatinine was slightly elevated however patient has been playing football daily. He was encouraged to hydrate and to follow-up with me.Patient states that his symptoms have decreased some since being seen in the ER. He now has a minimal sore throat and had one day where he felt like he had a fever. He also has experienced some decreased appetite. He denies any constipation, diarrhea, vomiting, severe headache, urinary symptoms, rash. Patient denies taking any new medications recently. He has not had to take Tylenol or Ibuprofen for any discomfort in the last 24 hours. He states the best position is when he is lying down. He denies any alcohol or illicit drug use.  ROS: Negative except mentioned above. Vitals as per Epic. GENERAL: NAD HEENT: mild pharyngeal erythema, no exudate, no erythema of TMs, no cervical LAD RESP: CTA B CARD: RRR ABD: positive bowel sounds, mild right upper quadrant tenderness, no rebound or guarding, no flank tenderness, no organomegaly appreciated on exam NEURO: CN II-XII grossly intact   A/P: Right upper quadrant abdominal pain, sore throat -liver function tests were not done in the ER, a CMP and CBC were drawn along with EBV titers. A throat culture was also done today. We will order abdominal ultrasound given patient's symptoms. We'll discuss further plan of care after labs and imaging has been reviewed. Seek medical attention if any acute problems. No athletic activity for now.

## 2018-05-11 ENCOUNTER — Ambulatory Visit
Admission: RE | Admit: 2018-05-11 | Discharge: 2018-05-11 | Disposition: A | Discharge status: Home / Self Care | Payer: BLUE CROSS/BLUE SHIELD | Source: Ambulatory Visit | Attending: Family Medicine | Admitting: Family Medicine

## 2018-05-11 DIAGNOSIS — R1011 Right upper quadrant pain: Secondary | ICD-10-CM | POA: Diagnosis not present

## 2018-05-13 ENCOUNTER — Other Ambulatory Visit: Payer: Self-pay | Admitting: Family Medicine

## 2018-05-13 ENCOUNTER — Ambulatory Visit (INDEPENDENT_AMBULATORY_CARE_PROVIDER_SITE_OTHER): Payer: BLUE CROSS/BLUE SHIELD | Admitting: Gastroenterology

## 2018-05-13 ENCOUNTER — Encounter: Payer: Self-pay | Admitting: Gastroenterology

## 2018-05-13 VITALS — BP 107/74 | HR 86 | Ht 74.0 in | Wt 217.4 lb

## 2018-05-13 DIAGNOSIS — R1010 Upper abdominal pain, unspecified: Secondary | ICD-10-CM | POA: Diagnosis not present

## 2018-05-13 LAB — CBC WITH DIFFERENTIAL/PLATELET
BASOS: 0 %
Basophils Absolute: 0 10*3/uL (ref 0.0–0.2)
EOS (ABSOLUTE): 0 10*3/uL (ref 0.0–0.4)
EOS: 1 %
HEMOGLOBIN: 15.9 g/dL (ref 13.0–17.7)
Hematocrit: 44.9 % (ref 37.5–51.0)
IMMATURE GRANS (ABS): 0 10*3/uL (ref 0.0–0.1)
Immature Granulocytes: 0 %
LYMPHS: 27 %
Lymphocytes Absolute: 1.1 10*3/uL (ref 0.7–3.1)
MCH: 32 pg (ref 26.6–33.0)
MCHC: 35.4 g/dL (ref 31.5–35.7)
MCV: 90 fL (ref 79–97)
Monocytes Absolute: 0.6 10*3/uL (ref 0.1–0.9)
Monocytes: 13 %
NEUTROS ABS: 2.5 10*3/uL (ref 1.4–7.0)
Neutrophils: 59 %
PLATELETS: 168 10*3/uL (ref 150–450)
RBC: 4.97 x10E6/uL (ref 4.14–5.80)
RDW: 12.8 % (ref 12.3–15.4)
WBC: 4.2 10*3/uL (ref 3.4–10.8)

## 2018-05-13 LAB — COMPREHENSIVE METABOLIC PANEL
ALK PHOS: 84 IU/L (ref 39–117)
ALT: 21 IU/L (ref 0–44)
AST: 17 IU/L (ref 0–40)
Albumin/Globulin Ratio: 1.6 (ref 1.2–2.2)
Albumin: 4.3 g/dL (ref 3.5–5.5)
BUN/Creatinine Ratio: 11 (ref 9–20)
BUN: 13 mg/dL (ref 6–20)
Bilirubin Total: 0.5 mg/dL (ref 0.0–1.2)
CALCIUM: 9.4 mg/dL (ref 8.7–10.2)
CO2: 26 mmol/L (ref 20–29)
Chloride: 101 mmol/L (ref 96–106)
Creatinine, Ser: 1.18 mg/dL (ref 0.76–1.27)
GFR calc Af Amer: 101 mL/min/{1.73_m2} (ref >59)
GFR calc non Af Amer: 88 mL/min/{1.73_m2} (ref >59)
GLUCOSE: 123 mg/dL — AB (ref 65–99)
Globulin, Total: 2.7 g/dL (ref 1.5–4.5)
Potassium: 4 mmol/L (ref 3.5–5.2)
SODIUM: 141 mmol/L (ref 134–144)
Total Protein: 7 g/dL (ref 6.0–8.5)

## 2018-05-13 LAB — EPSTEIN-BARR VIRUS VCA ANTIBODY PANEL
EBV EARLY ANTIGEN AB, IGG: 10.8 U/mL — AB (ref 0.0–8.9)
EBV NA IGG: 70.8 U/mL — AB (ref 0.0–17.9)
EBV VCA IGG: 56.3 U/mL — AB (ref 0.0–17.9)

## 2018-05-13 LAB — LIPASE: Lipase: 13 U/L (ref 13–78)

## 2018-05-13 LAB — CULTURE, GROUP A STREP

## 2018-05-13 LAB — AMYLASE: Amylase: 51 U/L (ref 31–124)

## 2018-05-13 MED ORDER — LIDOCAINE 5 % EX PTCH: 1.0000 | MEDICATED_PATCH | CUTANEOUS | 0 refills | Status: AC

## 2018-05-13 NOTE — Progress Notes (Signed)
Wyline Mood MD, MRCP(U.K) [address]  [address]  [place], [place] [address]  Main: [phone]  Fax: [phone]   Gastroenterology Consultation  Referring Provider:     Jolene Provost, MD Primary Care Physician:  Jolene Provost, MD Primary Gastroenterologist:  Dr. Wyline Mood  Reason for Consultation:     Abdominal pain         HPI:   Mario Stuart is a 22 y.o. y/o male referred for consultation & management  by Dr. Jolene Provost, MD.    He has been referred for abdominal pain by Dr Allena Katz. [facility] abdomen 05/11/18 showed a normal exam.   Labs 05/10/18 showed normal amylase, lipase,EBV IGM,CMP,CBC. He even presented to the ER on 05/07/18 with rt sided chest pain, found to have an elevated creatinine, given fluids and discharged.    Abdominal pain: Onset: Began 6 days back - woke up and had a pain in his rt side of his abdomen and rt shoulder, thought it was related to playing football. Started running around went to [employer] and came in to the ER. Yesterday for the first time he ran and felt better, presently reduced to a mild cramp when he runs and walks around. , no discomfort when sitting idle Site :below rt ribs  Radiation: to the center of his abdomen , no fever, no blood in stool  Severity :presently is 4/10 , on Friday was 7-8/10  Ashby Dawes of pain: like a dull ache  Aggravating factors: walking and running - more the movement more it hurst  Relieving factors :idle  Weight loss: lost some weight due to a slightly poor apatite the last few days NSAID use: Advil - mini gels - 3 at a time , twice a day and for the past 2-3 days - none long term  PPI use :none  Gall bladder surgery: intact  Frequency of bowel movements: every day - soft normal  Change in bowel movements: no  Relief with bowel movements: n0  Gas/Bloating/Abdominal distension: bloated, no difference when he passes gas.   Feels he is getting better overall slowly   Did not get any recent injury  .     Past Medical History:  Diagnosis Date  . Anxiety     History reviewed. No pertinent surgical history.  Prior to Admission medications   Medication Sig Start Date End Date Taking? Authorizing Provider  ibuprofen (ADVIL,MOTRIN) 800 MG tablet Take 1 tablet (800 mg total) by mouth every 8 (eight) hours as needed for mild pain or moderate pain (with food). 05/07/18  Yes Rockne Menghini, MD    Family History  Problem Relation Age of Onset  . Hypertension Father      Social History   Tobacco Use  . Smoking status: Never Smoker  . Smokeless tobacco: Former Engineer, water Use Topics  . Alcohol use: Yes  . Drug use: Not on file    Allergies as of 05/13/2018  . (No Known Allergies)    Review of Systems:    All systems reviewed and negative except where noted in HPI.   Physical Exam:  BP 107/74   Pulse 86   Ht 6\' 2"  (1.88 m)   Wt 217 lb 6.4 oz (98.6 kg)   BMI 27.91 kg/m  No LMP for male patient. Psych:  Alert and cooperative. Normal mood and affect. General:   Alert,  Well-developed, well-nourished, pleasant and cooperative in NAD Head:  Normocephalic and atraumatic. Eyes:  Sclera clear, no icterus.  Conjunctiva pink. Ears:  Normal auditory acuity. Nose:  No deformity, discharge, or lesions. Mouth:  No deformity or lesions,oropharynx pink & moist. Neck:  Supple; no masses or thyromegaly. Lungs:  Respirations even and unlabored.  Clear throughout to auscultation.   No wheezes, crackles, or rhonchi. No acute distress. Heart:  Regular rate and rhythm; no murmurs, clicks, rubs, or gallops. Abdomen:  Normal bowel sounds.  No bruits.  Soft, non-tender and non-distended without masses, hepatosplenomegaly or hernias noted.  No guarding or rebound tenderness.    He is tender over the lateral aspect of his right side of the rectum abdominus muscle , tenderness is reporduced when he tenses the muscle and resolved when he relaxes it.  Neurologic:  Alert and oriented  x3;  grossly normal neurologically. Skin:  Intact without significant lesions or rashes. No jaundice. Lymph Nodes:  No significant cervical adenopathy. Psych:  Alert and cooperative. Normal mood and affect.  Imaging Studies: Dg Chest 2 View  Result Date: 05/07/2018 CLINICAL DATA:  22 y/o M; L woke with pain from the right ribs through the right shoulder. EXAM: CHEST - 2 VIEW COMPARISON:  None. FINDINGS: The heart size and mediastinal contours are within normal limits. Both lungs are clear. The visualized skeletal structures are unremarkable. IMPRESSION: No active cardiopulmonary disease. Electronically Signed   By: Mitzi HansenLance  Furusawa-Stratton M.D.   On: 05/07/2018 18:27   [place] Abdomen Complete  Result Date: 05/11/2018 CLINICAL DATA:  Right upper quadrant abdominal pain radiating to the right shoulder. EXAM: ABDOMEN ULTRASOUND COMPLETE COMPARISON:  None. FINDINGS: Gallbladder: No gallstones or wall thickening visualized. No sonographic Murphy sign noted by sonographer. Common bile duct: Diameter: 4 mm Liver: No focal lesion identified. Within normal limits in parenchymal echogenicity. Portal vein is patent on color Doppler imaging with normal direction of blood flow towards the liver. IVC: No abnormality visualized. Pancreas: Visualized portion unremarkable. Spleen: Generous size but within normal limits at 11 x 5 cm. No focal abnormality. Right Kidney: Length: 12 cm. Echogenicity within normal limits. No mass or hydronephrosis visualized. Left Kidney: Length: 12 cm. Echogenicity within normal limits. No mass or hydronephrosis visualized. Abdominal aorta: No aneurysm visualized. IMPRESSION: Negative exam.  Normal gallbladder. Electronically Signed   By: Marnee SpringJonathon  Watts M.D.   On: 05/11/2018 09:41    Assessment and Plan:   Mario Stuart is a 22 y.o. y/o male has been referred for 6 day history of right sided abdominal pain. 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  Tender to touch on deep palpation over the lateral aspect of the rectus abdominus muscle on the right side. Reproducible with tensing his muscles. Overall the pain is down to 4/10 from 8/10 only elicited on exertion. My impression is that he has probably torn a few muscle fibers and it is gradually resolving. I explained that I expect complete resolution over 2-3 weeks time. In the meanwhile he can alternate advil with tylenol. Gradually exert as tolerated. IF it does not continue to resolve, advised to call me right away and then will consider at CT scan of the abdomen. He has no GI symptoms suggestive of colitis which can occasionally be seen in runners nor is the pain biliary in nature.   Follow up PRN  Dr Wyline MoodKiran Karishma Unrein MD,MRCP(U.K)

## 2018-05-18 ENCOUNTER — Ambulatory Visit: Payer: BLUE CROSS/BLUE SHIELD | Admitting: Gastroenterology

## 2018-07-16 ENCOUNTER — Encounter: Payer: Self-pay | Admitting: Family Medicine

## 2018-07-16 ENCOUNTER — Ambulatory Visit (INDEPENDENT_AMBULATORY_CARE_PROVIDER_SITE_OTHER): Payer: BLUE CROSS/BLUE SHIELD | Admitting: Family Medicine

## 2018-07-16 VITALS — Temp 97.7°F | Resp 14

## 2018-07-16 DIAGNOSIS — L089 Local infection of the skin and subcutaneous tissue, unspecified: Secondary | ICD-10-CM

## 2018-07-16 MED ORDER — SULFAMETHOXAZOLE-TRIMETHOPRIM 800-160 MG PO TABS: 1.0000 | ORAL_TABLET | Freq: Two times a day (BID) | ORAL | 0 refills | Status: AC

## 2018-07-16 NOTE — Progress Notes (Signed)
Patient presents today with history of small cut on dorsal aspect of his distal left third digit.  Patient is unsure as to when and what caused the cut.  Patient noticed it about 2 days ago.  He has been playing football without any issues.  He states that today it feels slightly better.  There is a small area proximal to the cut that appears to be  pus filled.  He admits to normal range of motion of the digit.  He denies any fever or chills.  He is unsure of his last tetanus shot.  Patient denies any history of MRSA in the past.  ROS: Negative except mentioned above. Vitals as per Epic. GENERAL: NAD RESP: CTA B CARD: RRR SKIN: Left third digit -approximately 2 mm superficial healing laceration on the distal aspect of the digit, minimal erythema around the site, there is a small area of what appears to be pus distal to the laceration, no erythema or swelling of the middle or proximal phalanx, full extension of the digit, minimally decreased flexion of the distal digit due to pain/swelling around the site, N/V intact NEURO: CN II-XII grossly intact   A/P: Left third digit superficial laceration, abscess -sterile technique was used to clean the site, 18-gauge needle was used to express the small area of pus, wound culture was taken, patient expressed improvement of discomfort around the site after the procedure, full range of motion after the procedure, area was cleaned and dressing was placed, oral antibiotic prescribed, keep area clean and dry, Tylenol/Ibuprofen as needed, no athletic activity today, recommend patient have athletic trainer/physician look at the area tomorrow prior to the game and also follow-up with athletic trainer/physician after the game. I have asked that he keep gauze/bandage over the area before putting on his gloves for football, last tetanus shot on record with health center was 2008.  Recommend patient make appointment with health center to get tetanus booster.  Seek medical  attention if any acute problems.  Everything above was discussed with athletic trainer.

## 2018-07-19 LAB — WOUND CULTURE: Organism ID, Bacteria: NONE SEEN

## 2018-09-24 IMAGING — US US ABDOMEN COMPLETE
1 series · 14 of 25 positions shown · non-contrast
Comparison: None.

CLINICAL DATA: Right upper quadrant abdominal pain radiating to the
right shoulder.

EXAM:
ABDOMEN ULTRASOUND COMPLETE

[Series 1: us abdomen complete · 0.23mm/px · 14 of 98 slices shown]
[im 1/98]
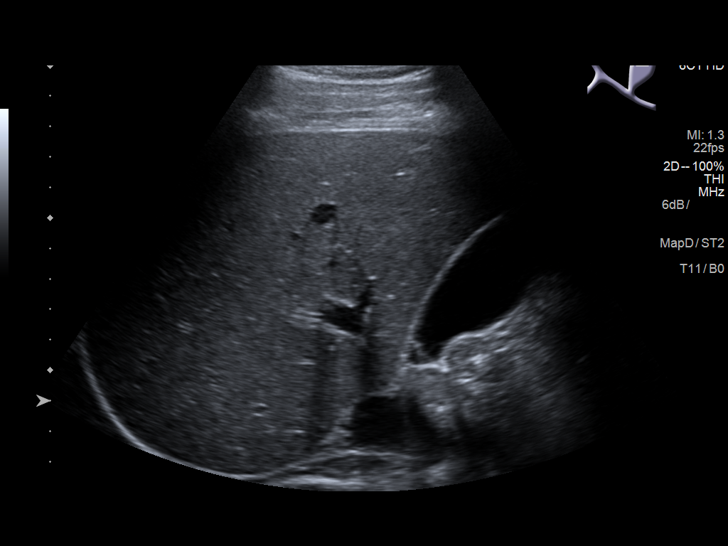
[im 9/98]
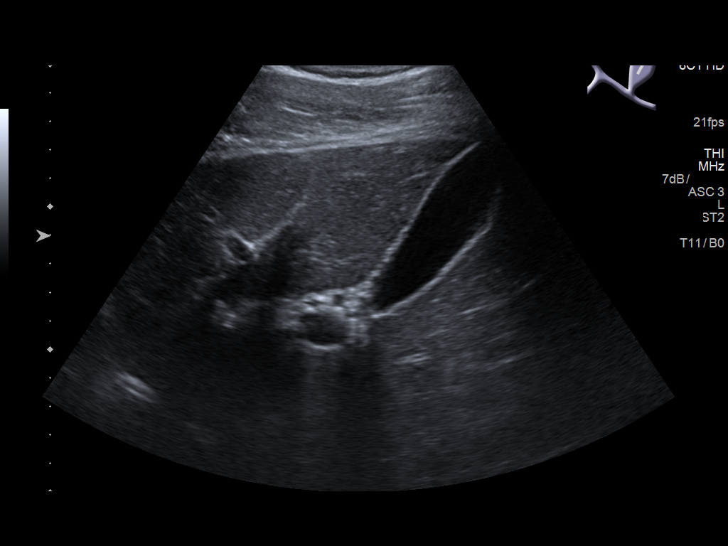
[im 17/98]
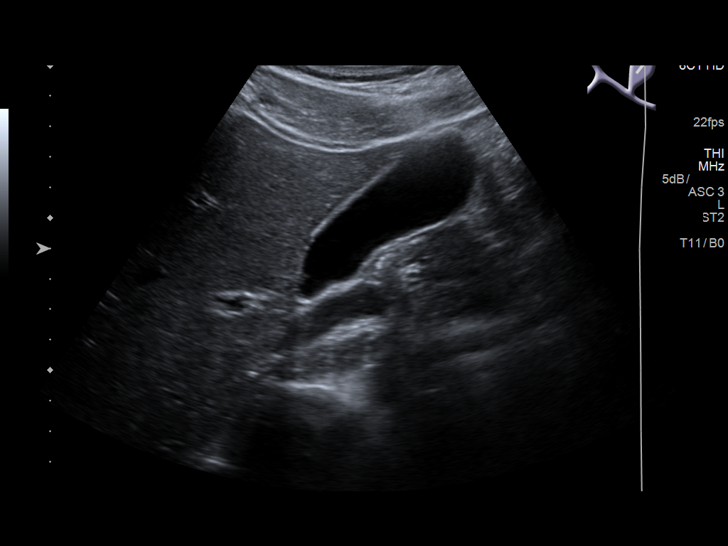
[im 25/98]
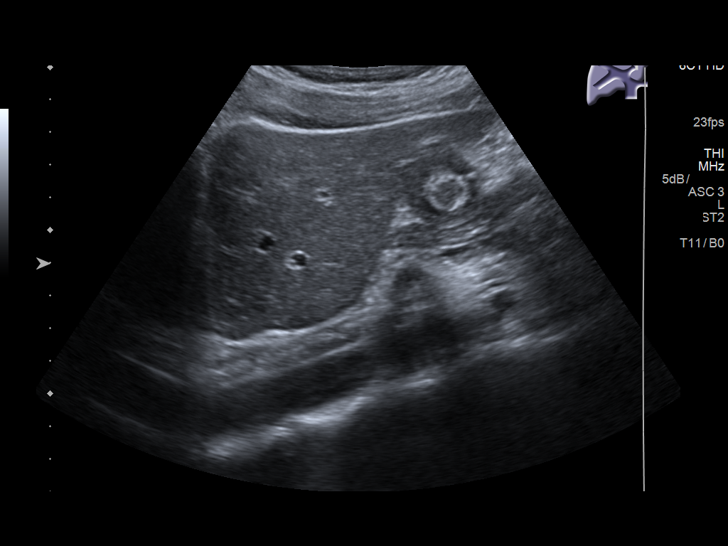
[im 33/98]
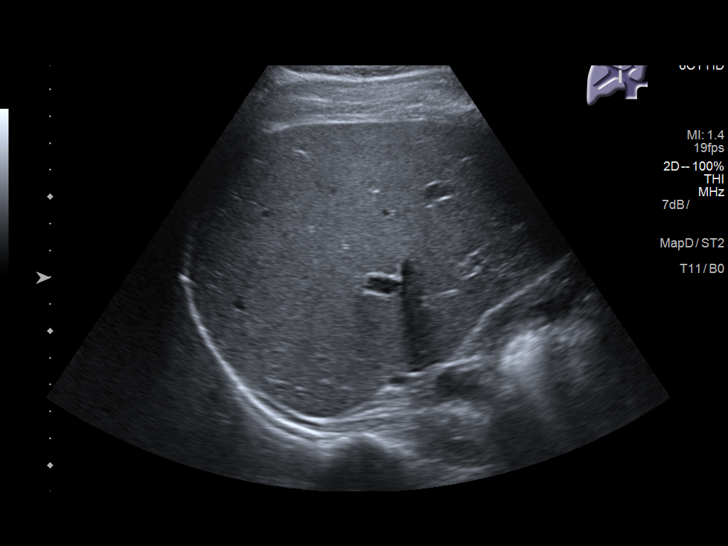
[im 37/98]
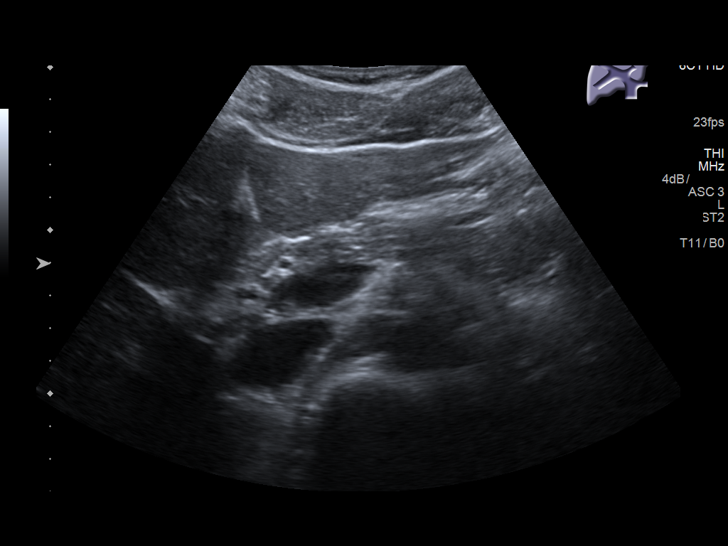
[im 45/98]
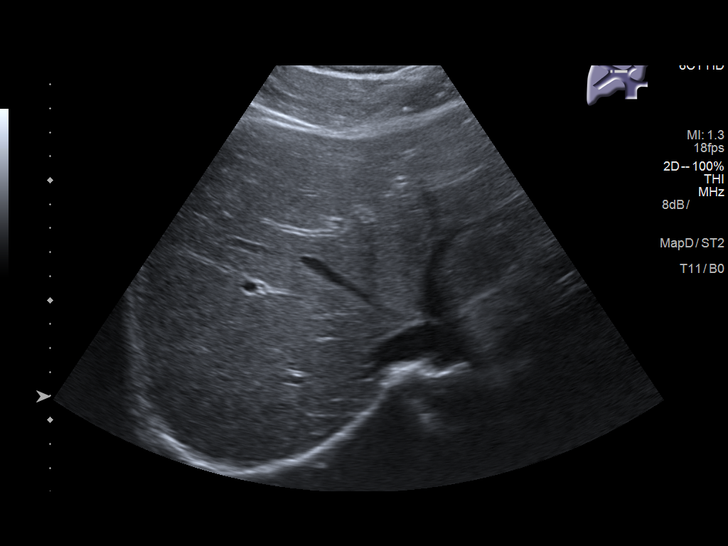
[im 53/98]
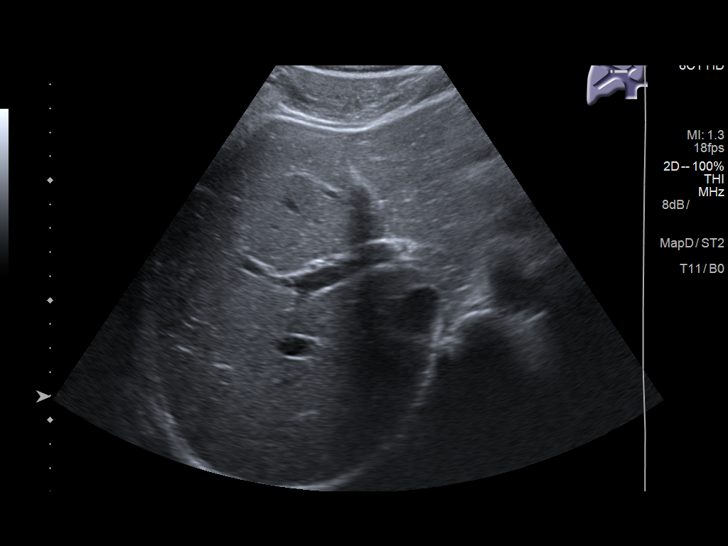
[im 61/98]
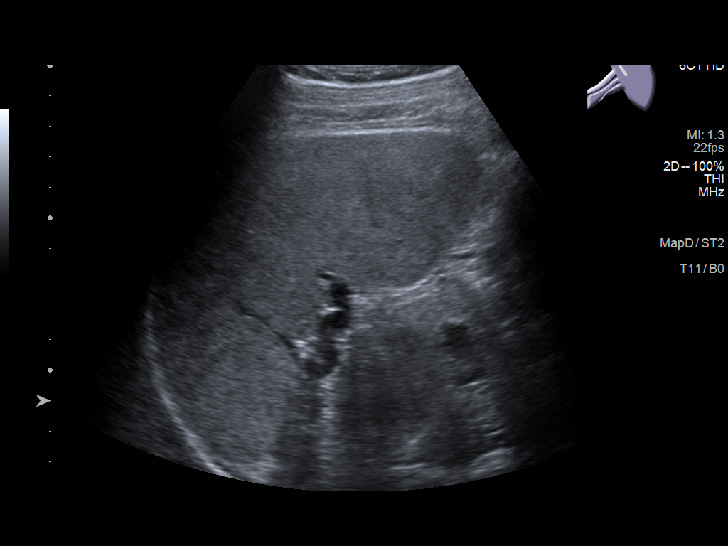
[im 65/98]
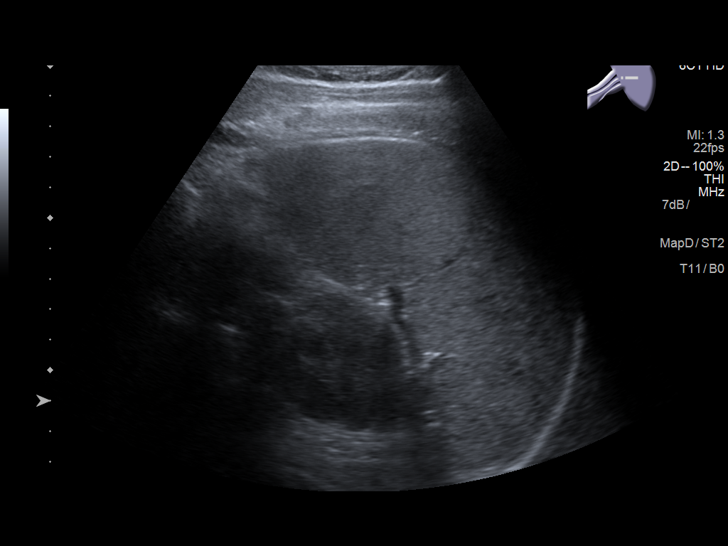
[im 73/98]
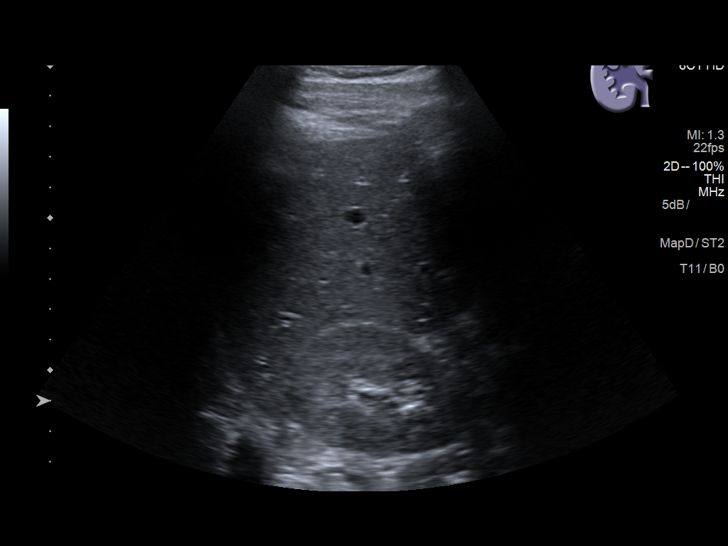
[im 81/98]
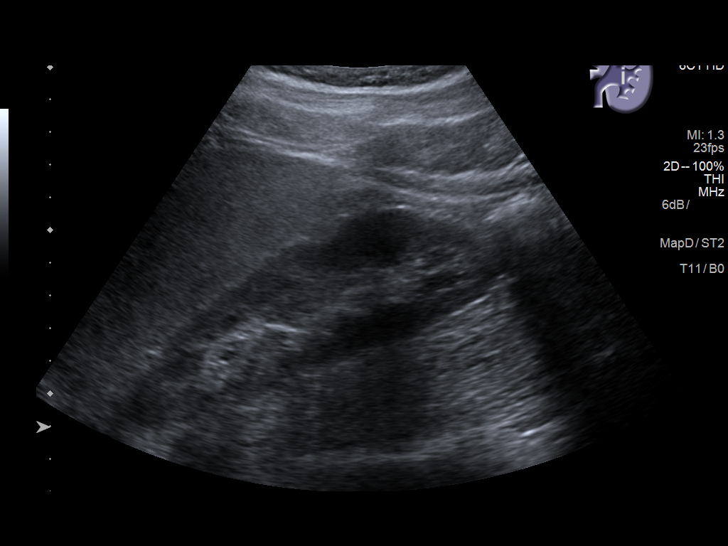
[im 89/98]
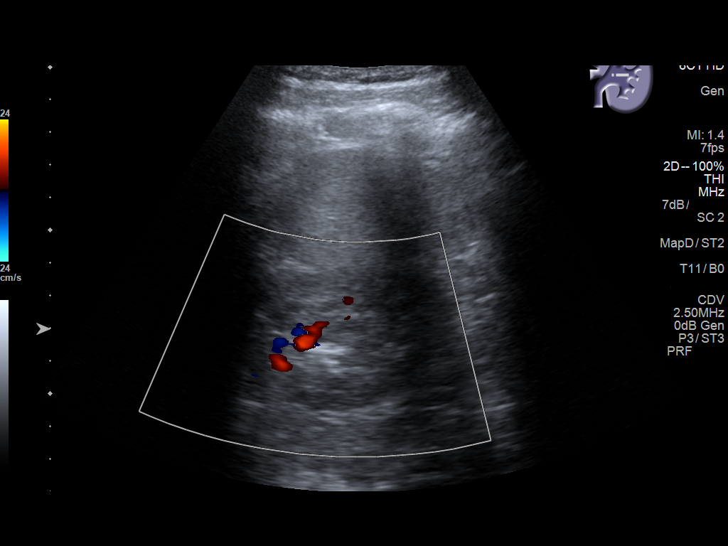
[im 98/98]
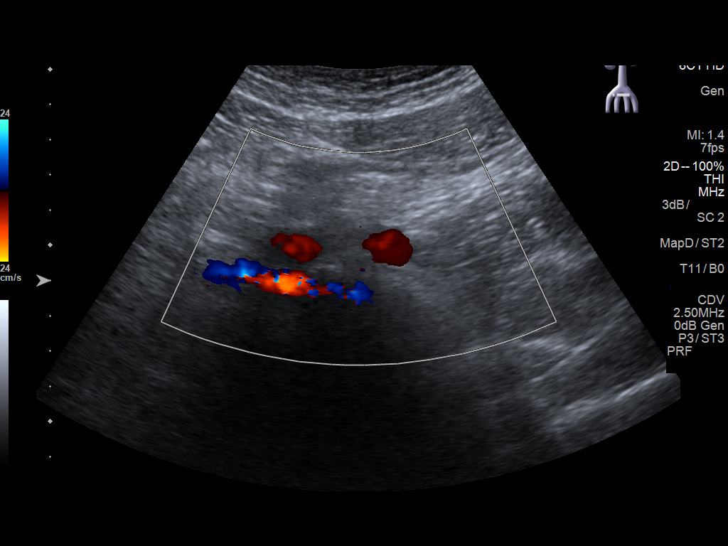

[14 of 25 positions shown; findings below may reference images not displayed]

FINDINGS: Gallbladder: No gallstones or wall thickening visualized. No
sonographic Murphy sign noted by sonographer.

Common bile duct: Diameter: 4 mm

Liver: No focal lesion identified. Within normal limits in
parenchymal echogenicity. Portal vein is patent on color Doppler
imaging with normal direction of blood flow towards the liver.

IVC: No abnormality visualized.

Pancreas: Visualized portion unremarkable.

Spleen: Generous size but within normal limits at 11 x 5 cm. No
focal abnormality.

Right Kidney: Length: 12 cm. Echogenicity within normal limits. No
mass or hydronephrosis visualized.

Left Kidney: Length: 12 cm. Echogenicity within normal limits. No
mass or hydronephrosis visualized.

Abdominal aorta: No aneurysm visualized.
IMPRESSION: Negative exam.  Normal gallbladder.

## 2018-10-01 DIAGNOSIS — S0181XA Laceration without foreign body of other part of head, initial encounter: Secondary | ICD-10-CM

## 2018-10-01 MED ORDER — diphth,pertus(acell),tetanus (BOOSTRIX TDAP) 2.5-8-5 Lf-mcg-Lf/0.5mL syringe 0.5 mL
2.5-8-5 | Freq: Once | INTRAMUSCULAR | Status: AC
Start: 2018-10-01 — End: 2018-10-01
  Administered 2018-10-02: 05:00:00 0.5 mL via INTRAMUSCULAR

## 2018-10-01 MED FILL — BOOSTRIX TDAP 2.5 LF UNIT-8 MCG-5 LF/0.5 ML INTRAMUSCULAR SYRINGE: 2.5-8-5 2.5-8-5 Lf-mcg-Lf/0.5mL | INTRAMUSCULAR | Qty: 0.5

## 2018-10-01 NOTE — Unmapped (Signed)
[facility] ED Note    Date of service:  10/01/2018    Reason for Visit: Laceration (sustained lac to forehead during football practice @ approx 1130.)      Patient History     HPI This is an otherwise healthy 23 year old male patient who was playing football approximately 8 hours prior to arrival when he was elbowed in the forehead and sustained a laceration.  This was approximated with Steri-Strips by his trainer.  He has noted increasing swelling since the injury, complains of a mild frontal headache.  Did not lose consciousness or fall to the ground.  Denies neck pain or stiffness, no nausea, vomiting, dizziness, lightheadedness or visual disturbance.  His tetanus is due for an update.  He otherwise has no complaints.     History reviewed. No pertinent past medical history.    History reviewed. No pertinent surgical history.    Patient  reports that he has never smoked. He does not have any smokeless tobacco history on file. No history on file for alcohol and drug.      Previous Medications    No medications on file       Allergies:   Allergies as of 10/01/2018   ??? (No Known Allergies)       Review of Systems     Review of Systems   Constitutional: Negative for chills and fever.   HENT: Positive for facial swelling (forehead). Negative for congestion, rhinorrhea and sore throat.    Respiratory: Negative for cough and shortness of breath.    Cardiovascular: Negative for chest pain and palpitations.   Gastrointestinal: Negative for abdominal pain, nausea and vomiting.   Musculoskeletal: Negative for arthralgias and myalgias.   Skin: Positive for wound (forehead laceration). Negative for color change and rash.   Neurological: Positive for headaches (frontal). Negative for dizziness, syncope and light-headedness.   All other systems reviewed and are negative.          Physical Exam     ED Triage Vitals [10/01/18 2142]   Vital Signs Group      Temp 97.8 ??F (36.6 ??C)       Temp Source Oral      Heart Rate 76      Heart Rate Source Monitor      Resp 16      SpO2 100 %      BP 150/79      MAP (mmHg)       BP Location Right arm      BP Method Automatic      Patient Position Sitting   SpO2 100 %   O2 Device None (Room air)       Physical Exam   Vitals reviewed.  Constitutional: He appears well-developed and well-nourished. No distress.   HENT:   Head: Normocephalic. Head is with contusion (mid forehead) and with laceration (superficial vertical laceration at mid-forehead). Head is without raccoon's eyes and without Battle's sign.   Mouth/Throat: Mucous membranes are normal.   Eyes: Conjunctivae and EOM are normal.   Neck: Normal range of motion. Neck supple.   Cardiovascular: Normal rate and regular rhythm. Exam reveals no gallop and no friction rub.   No murmur heard.  Pulmonary/Chest: Effort normal. No respiratory distress. He has no wheezes. He has no rhonchi. He has no rales.   Neurological: He is alert.  He is oriented to person, place and time.  He has normal strength. No cranial nerve deficit.   Skin: Skin is  warm and dry. No bruising, no petechiae and no rash noted.   Psychiatric: He has a normal mood and affect. His behavior is normal.       Procedures    ED Course and MDM     Khylin Keo is a 23 y.o. male who presented to the emergency department with Laceration (sustained lac to forehead during football practice @ approx 1130.)    The patient presents with a superficial forehead laceration with contusion after being elbowed earlier this afternoon while playing football.  His tetanus is updated here the department.  The wound was cleansed and did not open, no closure was indicated.  He is given wound care and scalp contusion instructions.  The patient will follow-up with his primary care provider in 3 days if no improvement or return to the ED for any worsening symptoms.    The patient tolerated their visit well.  The patient and / or the family were informed of the results  of any tests, a time was given to answer questions, a plan was proposed and they agreed with plan.     DISCHARGE DIAGNOSES:      ICD-9-CM ICD-10-CM    1. Forehead contusion, initial encounter 920 S00.83XA    2. Forehead laceration, initial encounter 873.42 S01.81XA        There are no discharge medications for this patient.             Willette Pa, [place]  10/02/18 [phone]

## 2018-10-01 NOTE — Unmapped (Signed)
ED Attending Attestation Note    Date of service:  10/01/2018    This patient was seen by the advanced practice provider.  I have seen and examined the patient, agree with the workup, evaluation, management and diagnosis.  The care plan has been discussed and I concur.      My assessment reveals a 23 y.o. male who presents after he was elbowed in the forehead while playing football this morning.  He sustained a small laceration to the area but no loss of consciousness.  No headache, lightheadedness, vision changes, nausea, or vomiting.  On examination, he is well appearing and answers questions appropriately.  Superficial forehead laceration which is hemostatic.  Cranial nerves intact, strength symmetric in all extremities.

## 2018-10-01 NOTE — Unmapped (Signed)
Sustained lac to forehead during football practice today.  Denies LOC.

## 2018-10-02 ENCOUNTER — Inpatient Hospital Stay
Admit: 2018-10-02 | Discharge: 2018-10-02 | Disposition: A | Discharge status: Home / Self Care | Payer: PRIVATE HEALTH INSURANCE

## 2018-10-02 MED ORDER — bacitracin-polymyxin b (POLYSPORIN) ointment (packets)
500-10000 | Freq: Once | TOPICAL | Status: AC
Start: 2018-10-02 — End: 2018-10-02
  Administered 2018-10-02: 05:00:00 1 via TOPICAL

## 2018-10-02 MED FILL — BACITRACIN-POLYMYXIN B 500 UNIT-10,000 UNIT/GRAM TOPICAL PACKET: 500-10000 500-10,000 unit/gram | TOPICAL | Qty: 1

## 2018-10-15 ENCOUNTER — Ambulatory Visit: Payer: BLUE CROSS/BLUE SHIELD | Admitting: Family Medicine

## 2018-11-22 ENCOUNTER — Ambulatory Visit (INDEPENDENT_AMBULATORY_CARE_PROVIDER_SITE_OTHER): Payer: BLUE CROSS/BLUE SHIELD | Admitting: Family Medicine

## 2018-11-22 ENCOUNTER — Encounter: Payer: Self-pay | Admitting: Family Medicine

## 2018-11-22 VITALS — BP 137/50 | HR 67 | Temp 98.5°F | Resp 14

## 2018-11-22 DIAGNOSIS — R300 Dysuria: Secondary | ICD-10-CM | POA: Diagnosis not present

## 2018-11-22 MED ORDER — CEFIXIME 400 MG PO CAPS: ORAL_CAPSULE | ORAL | 0 refills | Status: AC

## 2018-11-22 MED ORDER — AZITHROMYCIN 500 MG PO TABS: ORAL_TABLET | ORAL | 0 refills | Status: AC

## 2018-11-22 NOTE — Progress Notes (Signed)
Patient presents today with history of dysuria for a few days.  Patient denies any penile discharge or genital rash.  He denies any abdominal pain, nausea, vomiting, flank pain.  He denies any history of STDs in the past.  He states that his girlfriend of 1.5 months was diagnosed with a UTI recently.  He denies being told that she has a STD.  He admits to unprotected sex.  He declines HIV testing.  ROS: Negative except mentioned above. Vitals as per Epic. GENERAL: NAD HEENT: no pharyngeal erythema, no exudate RESP: CTA B CARD: RRR ABD: Bowel sounds, nontender, no rebound or guarding appreciated, no flank tenderness GU: Deferred NEURO: CN II-XII grossly intact   Urine Dip: 2+ leukocytes, negative nitrites, 1+ protein, pH 7.0, negative blood, SG 1.015, trace ketones, negative glucose   A/P: Dysuria - will send urine for culture and also for GC/Chlamydia/Trichomonas testing, will treat with Cefixime and Azithromycin.  Safe sex practices encouraged.  HIV testing encouraged if having unprotected sex.  Patient address understanding of above.

## 2018-11-24 LAB — URINE CULTURE: Organism ID, Bacteria: NO GROWTH

## 2018-11-24 LAB — CHLAMYDIA/GONOCOCCUS/TRICHOMONAS, NAA
CHLAMYDIA BY NAA: NEGATIVE
Gonococcus by NAA: NEGATIVE
TRICH VAG BY NAA: NEGATIVE

## 2019-02-22 ENCOUNTER — Ambulatory Visit: Payer: PRIVATE HEALTH INSURANCE

## 2019-03-08 ENCOUNTER — Ambulatory Visit: Payer: PRIVATE HEALTH INSURANCE

## 2019-03-16 ENCOUNTER — Ambulatory Visit: Admit: 2019-03-16 | Discharge: 2019-03-16 | Payer: PRIVATE HEALTH INSURANCE

## 2019-03-16 DIAGNOSIS — L0591 Pilonidal cyst without abscess: Secondary | ICD-10-CM

## 2019-03-16 NOTE — Unmapped (Signed)
Chief Complaint:    Chief Complaint   Patient presents with   ??? Pilonidal Cyst     New patient       Subjective   HPI:   Patient ID: Eric Miranda is a 23 y.o. male.  23 yo M presents for evaluation of a pilonidal cyst. The area has been bothersome for almost two years. The top of the gluteal cleft will swell until he manually encourages drainage from pits below the cyst. He has never had an active infection, been treated with antibiotics, or undergone I&D of the area. Currently no exacerbation of the symptoms.  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           Allergies  Patient has no known allergies.    Medications  Outpatient Encounter Medications as of 03/16/2019   Medication Sig Dispense Refill   ??? sertraline (ZOLOFT) 25 MG tablet Take 25 mg by mouth daily.       No facility-administered encounter medications on file as of 03/16/2019.         Histories  He has no past medical history on file.    He has no past surgical history on file.    His family history is not on file.    He reports that he has never smoked. He has never used smokeless tobacco. He reports that he does not drink alcohol or use drugs.    The following portions of the patient's history were reviewed and updated as appropriate: allergies, current medications, past family history, past medical history, past social history, past surgical history, problem list and review of systems.    ROS:   Review of Systems   Constitutional: Negative for appetite change, chills, diaphoresis and fatigue.   HENT: Negative for congestion, dental problem, drooling, hearing loss, mouth sores, postnasal drip, rhinorrhea, sneezing, sore throat, tinnitus, trouble swallowing and voice change.    Eyes: Negative for photophobia, pain, discharge, redness and visual disturbance.   Respiratory: Negative for cough, chest tightness and stridor.    Cardiovascular: Negative for chest pain and leg swelling.   Gastrointestinal: Negative for abdominal  pain, anal bleeding, blood in stool, constipation, nausea and vomiting.   Musculoskeletal: Negative for arthralgias, gait problem, neck pain and neck stiffness.   Skin: Positive for wound.   Neurological: Negative for tremors, speech difficulty, light-headedness, numbness and headaches.   Hematological: Negative for adenopathy.   Psychiatric/Behavioral: Negative for behavioral problems, decreased concentration, dysphoric mood, hallucinations and sleep disturbance. The patient is not nervous/anxious and is not hyperactive.    All other systems reviewed and are negative.      Objective:   Physical Exam  Vitals signs reviewed.   Constitutional:       General: He is not in acute distress.     Appearance: He is well-developed. He is not diaphoretic.   HENT:      Head: Normocephalic and atraumatic.   Eyes:      General: No scleral icterus.     Conjunctiva/sclera: Conjunctivae normal.      Pupils: Pupils are equal, round, and reactive to light.   Cardiovascular:      Rate and Rhythm: Normal rate.   Pulmonary:      Effort: Pulmonary effort is normal. No respiratory distress.   Abdominal:      General: There is no distension.      Palpations: Abdomen is soft. There is no mass.      Tenderness:  There is no abdominal tenderness. There is no guarding or rebound.   Musculoskeletal: Normal range of motion.   Skin:     General: Skin is warm and dry.      Findings: No erythema.          Neurological:      Mental Status: He is alert and oriented to person, place, and time.            Assessment/Plan:   Pilonidal cyst and sinuses  Will plan for excision of the area following his final football season this fall  We discussed warning signs of active infection that would need evaluation for antibiotics or drainage  He will call 6 weeks prior to desired excision to appropriately time the operation after his football season during a time that he can lay off intense workouts for 6 weeks    Yolanda Bonine, MD   General Surgery  [place]  [facility]  [facility]  Medical Office [phone]  Cell phone [phone]

## 2019-03-17 ENCOUNTER — Other Ambulatory Visit: Payer: Self-pay | Admitting: *Deleted

## 2019-03-17 DIAGNOSIS — Z20822 Contact with and (suspected) exposure to covid-19: Secondary | ICD-10-CM

## 2019-03-21 ENCOUNTER — Other Ambulatory Visit: Payer: Self-pay

## 2019-03-21 DIAGNOSIS — Z20822 Contact with and (suspected) exposure to covid-19: Secondary | ICD-10-CM

## 2019-03-26 LAB — NOVEL CORONAVIRUS, NAA: SARS-CoV-2, NAA: NOT DETECTED

## 2019-03-29 ENCOUNTER — Other Ambulatory Visit: Payer: Self-pay | Admitting: Family Medicine

## 2019-03-29 DIAGNOSIS — Z20828 Contact with and (suspected) exposure to other viral communicable diseases: Secondary | ICD-10-CM

## 2019-03-29 DIAGNOSIS — Z20822 Contact with and (suspected) exposure to covid-19: Secondary | ICD-10-CM

## 2019-04-03 LAB — NOVEL CORONAVIRUS, NAA: SARS-CoV-2, NAA: NOT DETECTED

## 2019-04-25 ENCOUNTER — Other Ambulatory Visit: Payer: Self-pay

## 2019-04-25 DIAGNOSIS — Z20822 Contact with and (suspected) exposure to covid-19: Secondary | ICD-10-CM

## 2019-04-26 LAB — NOVEL CORONAVIRUS, NAA: SARS-CoV-2, NAA: NOT DETECTED

## 2019-06-03 ENCOUNTER — Other Ambulatory Visit: Payer: Self-pay

## 2019-06-03 DIAGNOSIS — Z20822 Contact with and (suspected) exposure to covid-19: Secondary | ICD-10-CM

## 2019-06-04 LAB — NOVEL CORONAVIRUS, NAA: SARS-CoV-2, NAA: DETECTED — AB

## 2019-06-14 ENCOUNTER — Other Ambulatory Visit: Payer: Self-pay | Admitting: Family Medicine

## 2019-06-14 DIAGNOSIS — I400 Infective myocarditis: Secondary | ICD-10-CM

## 2019-06-14 DIAGNOSIS — U071 COVID-19: Secondary | ICD-10-CM

## 2019-06-14 DIAGNOSIS — Z8619 Personal history of other infectious and parasitic diseases: Secondary | ICD-10-CM

## 2019-06-14 DIAGNOSIS — Z8616 Personal history of COVID-19: Secondary | ICD-10-CM

## 2019-06-16 ENCOUNTER — Other Ambulatory Visit: Payer: Self-pay

## 2019-06-16 ENCOUNTER — Other Ambulatory Visit: Payer: BC Managed Care – PPO | Admitting: Family Medicine

## 2019-06-16 DIAGNOSIS — Z0000 Encounter for general adult medical examination without abnormal findings: Secondary | ICD-10-CM

## 2019-06-17 LAB — TROPONIN I: Troponin I: <0.01 ng/mL (ref 0.00–0.04)

## 2019-06-20 ENCOUNTER — Ambulatory Visit (INDEPENDENT_AMBULATORY_CARE_PROVIDER_SITE_OTHER): Payer: BC Managed Care – PPO

## 2019-06-20 ENCOUNTER — Other Ambulatory Visit: Payer: Self-pay

## 2019-06-20 DIAGNOSIS — Z8619 Personal history of other infectious and parasitic diseases: Secondary | ICD-10-CM | POA: Diagnosis not present

## 2019-06-20 DIAGNOSIS — U071 COVID-19: Secondary | ICD-10-CM

## 2019-06-20 DIAGNOSIS — I400 Infective myocarditis: Secondary | ICD-10-CM | POA: Diagnosis not present

## 2019-06-20 DIAGNOSIS — Z8616 Personal history of COVID-19: Secondary | ICD-10-CM

## 2019-06-26 ENCOUNTER — Telehealth: Payer: Self-pay | Admitting: Family Medicine

## 2019-06-26 NOTE — Telephone Encounter (Signed)
Called and explained to patient that Cardiology has reviewed the ECG and Echocardiogram and has not informed me that any further cardiac workup is needed for patient to be able to resume physical activity. Patient is to seek medical attention if any cardiopulmonary symptoms develop once activity is resumed. Patient addresses understanding and has no further questions or concerns.  

## 2019-06-27 ENCOUNTER — Other Ambulatory Visit: Payer: BC Managed Care – PPO

## 2019-07-21 ENCOUNTER — Ambulatory Visit
Admission: RE | Admit: 2019-07-21 | Discharge: 2019-07-21 | Disposition: A | Discharge status: Home / Self Care | Payer: BC Managed Care – PPO | Source: Ambulatory Visit | Attending: Family Medicine | Admitting: Family Medicine

## 2019-07-21 ENCOUNTER — Telehealth: Payer: BC Managed Care – PPO | Admitting: Family Medicine

## 2019-07-21 ENCOUNTER — Other Ambulatory Visit: Payer: Self-pay

## 2019-07-21 ENCOUNTER — Other Ambulatory Visit: Payer: Self-pay | Admitting: Family Medicine

## 2019-07-21 DIAGNOSIS — R059 Cough, unspecified: Secondary | ICD-10-CM

## 2019-07-21 DIAGNOSIS — R05 Cough: Secondary | ICD-10-CM

## 2019-07-22 ENCOUNTER — Other Ambulatory Visit: Payer: Self-pay

## 2019-07-22 ENCOUNTER — Ambulatory Visit (INDEPENDENT_AMBULATORY_CARE_PROVIDER_SITE_OTHER): Payer: BC Managed Care – PPO | Admitting: Family Medicine

## 2019-07-22 VITALS — BP 147/80 | HR 70 | Temp 98.0°F

## 2019-07-22 DIAGNOSIS — R059 Cough, unspecified: Secondary | ICD-10-CM

## 2019-07-22 DIAGNOSIS — R0602 Shortness of breath: Secondary | ICD-10-CM | POA: Diagnosis not present

## 2019-07-22 DIAGNOSIS — R0789 Other chest pain: Secondary | ICD-10-CM

## 2019-07-22 DIAGNOSIS — Z8619 Personal history of other infectious and parasitic diseases: Secondary | ICD-10-CM | POA: Diagnosis not present

## 2019-07-22 DIAGNOSIS — R05 Cough: Secondary | ICD-10-CM | POA: Diagnosis not present

## 2019-07-22 DIAGNOSIS — J40 Bronchitis, not specified as acute or chronic: Secondary | ICD-10-CM

## 2019-07-25 LAB — SPECIMEN STATUS REPORT

## 2019-07-26 LAB — CBC WITH DIFFERENTIAL/PLATELET
Basophils Absolute: 0 10*3/uL (ref 0.0–0.2)
Basos: 0 %
EOS (ABSOLUTE): 0.1 10*3/uL (ref 0.0–0.4)
Eos: 2 %
Hematocrit: 46.9 % (ref 37.5–51.0)
Hemoglobin: 15.8 g/dL (ref 13.0–17.7)
Immature Grans (Abs): 0 10*3/uL (ref 0.0–0.1)
Immature Granulocytes: 0 %
Lymphocytes Absolute: 1.9 10*3/uL (ref 0.7–3.1)
Lymphs: 38 %
MCH: 30.8 pg (ref 26.6–33.0)
MCHC: 33.7 g/dL (ref 31.5–35.7)
MCV: 91 fL (ref 79–97)
Monocytes Absolute: 0.3 10*3/uL (ref 0.1–0.9)
Monocytes: 7 %
Neutrophils Absolute: 2.6 10*3/uL (ref 1.4–7.0)
Neutrophils: 53 %
Platelets: 255 10*3/uL (ref 150–450)
RBC: 5.13 x10E6/uL (ref 4.14–5.80)
RDW: 12.8 % (ref 11.6–15.4)
WBC: 4.9 10*3/uL (ref 3.4–10.8)

## 2019-07-26 LAB — D-DIMER, QUANTITATIVE

## 2019-07-26 LAB — TROPONIN I: Troponin I: <0.01 ng/mL (ref 0.00–0.04)

## 2019-08-25 NOTE — Progress Notes (Signed)
Virtual Visit Agreed To By Patient   Patient admits to a productive cough and colored mucus. Admits to COVID-19 a previously weeks ago. Denies chest pain but admits to slight shortness of breath with coughing and activity. Unsure about wheezing. Is able to talk in sentences without difficulty. Denies any swelling or pain of extremities. Denies hx of DVT, PE, asthma, smoking. He states that he was seen at the health center and put him on Z-pk that he has been taking.   ROS: Negative except mentioned above. No Vitals but denies fever GENERAL: NAD NEURO: CN II-XII grossly intact  No other exam.  A/P: Bronchitis - will do CXR, recommend monitoring symptoms closely, already had cardiac work-up after COVID-19, discussed oral steroids and Albuterol prn, cough suppressant prn, seek medical attention if symptoms persist or worsen, no athletic activity until results reviewed and symptoms have improved. Discussed with athletic trainer.

## 2020-02-08 ENCOUNTER — Ambulatory Visit: Admit: 2020-02-08 | Payer: PRIVATE HEALTH INSURANCE

## 2020-02-08 ENCOUNTER — Ambulatory Visit: Admit: 2020-02-08 | Discharge: 2020-02-08 | Payer: PRIVATE HEALTH INSURANCE

## 2020-02-08 DIAGNOSIS — Z0000 Encounter for general adult medical examination without abnormal findings: Secondary | ICD-10-CM

## 2020-02-08 DIAGNOSIS — F419 Anxiety disorder, unspecified: Secondary | ICD-10-CM

## 2020-02-08 LAB — CHLAMYDIA / GONORRHOEAE DNA URINE
Chlamydia Trachomatis DNA Urine: NEGATIVE
Neisseria gonorrhoeae DNA Urine: NEGATIVE

## 2020-02-08 LAB — HIV 1+2 ANTIBODY/ANTIGEN WITH REFLEX: HIV 1+2 AB/AGN: NONREACTIVE

## 2020-02-08 LAB — HEPATITIS C ANTIBODY: HCV Ab: NONREACTIVE

## 2020-02-08 MED ORDER — sertraline (ZOLOFT) 25 MG tablet
25 | ORAL_TABLET | Freq: Every day | ORAL | 5 refills | Status: AC
Start: 2020-02-08 — End: 2020-03-23

## 2020-02-08 MED ORDER — sertraline (ZOLOFT) 100 MG tablet
100 | ORAL_TABLET | Freq: Every day | ORAL | 5 refills | Status: AC
Start: 2020-02-08 — End: 2020-03-23

## 2020-02-08 NOTE — Unmapped (Signed)
Department of Internal Medicine   [facility]  New Patient Visit      HPI / Subjective:     Eric Miranda is a 24 y.o. male who  has a past medical history of Anxiety, Depression, and Seasonal allergies., presenting today to establish care. He has not had a primary care doctor since seeing his pediatrician.     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     Seasonal Allergies  - Normally gets allergies this time of year  - Using claritin and flonase right now   - Runny nose  - Post-nasal drip with a cough  - No recent fevers or chills   - Mild cough from post-nasal drip   - No eye symptoms   - He has been eating well without abd pain or N/V/C/D.     Past Medical Hx:     Past Medical History:   Diagnosis Date   ??? Anxiety    ??? Depression    ??? Seasonal allergies        Past Surgical Hx:     Past Surgical History:   Procedure Laterality Date   ??? WISDOM TOOTH EXTRACTION         Family Hx:     Family History   Problem Relation Age of Onset   ??? No Known Problems Mother    ??? Valvular heart disease Father        Social Hx:     Social History     Socioeconomic History   ??? Marital status: Single     Spouse name: None   ??? Number of children: None   ??? Years of education: None   ??? Highest education level: None   Occupational History   ??? None   Tobacco Use   ??? Smoking status: Never Smoker   ??? Smokeless tobacco: Never Used   Substance and Sexual  Activity   ??? Alcohol use: Yes     Comment: once every other weekend    ??? Drug use: Never   ??? Sexual activity: Yes   Other Topics Concern   ??? Caffeine Use No   ??? Occupational Exposure No   ??? Exercise Yes   ??? Seat Belt Yes   Social History Narrative   ??? None     Social Determinants of Health     Financial Resource Strain:    ??? Difficulty of Paying Living Expenses:    Food Insecurity:    ??? Worried About Programme researcher, broadcasting/film/video in the Last Year:    ??? Barista in the Last Year:    Transportation Needs:    ??? Freight forwarder (Medical):    ??? Lack of Transportation (Non-Medical):    Physical  Activity:    ??? Days of Exercise per Week:    ??? Minutes of Exercise per Session:    Stress:    ??? Feeling of Stress :    Social Connections:    ??? Frequency of Communication with Friends and Family:    ??? Frequency of Social Gatherings with Friends and Family:    ??? Attends Religious Services:    ??? Database administrator or Organizations:    ??? Attends Engineer, structural:    ??? Marital Status:    Intimate Programme researcher, broadcasting/film/video Violence:    ??? Fear of Current or Ex-Partner:    ??? Emotionally Abused:    ??? Physically Abused:    ??? Sexually Abused:        ROS:   Review of Systems   Constitutional: Positive for malaise/fatigue. Negative for chills, fever and weight loss.   HENT: Positive for congestion. Negative for hearing loss, sinus pain and sore throat.         +rhinorrhea with post-nasal drip    Eyes: Negative for blurred vision, double vision, pain and discharge.   Respiratory: Positive for cough. Negative for sputum production, shortness of breath and wheezing.    Cardiovascular: Negative for chest pain, palpitations, orthopnea, leg swelling and PND.   Gastrointestinal: Negative for abdominal pain, constipation, diarrhea, nausea and vomiting.   Genitourinary: Negative for dysuria, frequency, hematuria and urgency.   Musculoskeletal: Negative for back pain, joint pain, myalgias and neck pain.   Skin: Negative for rash.        No ulcers, no  erythema   Neurological: Negative for dizziness, tingling, sensory change, focal weakness, weakness and headaches.   Endo/Heme/Allergies: Negative for environmental allergies and polydipsia. Does not bruise/bleed easily.   Psychiatric/Behavioral: Negative for depression and suicidal ideas. The patient is nervous/anxious. The patient does not have insomnia.    All other systems reviewed and are negative.       Medications:     No current outpatient medications on file prior to visit.     No current facility-administered medications on file prior to visit.       Vital Signs:   BP 126/84 (BP Location: Left arm, Patient Position: Sitting, BP Cuff Size: Regular)    Pulse 79    Temp 97 ??F (36.1 ??C)    Resp 12    Ht 6' 2 (1.88 m)    Wt (!) 223 lb (101.2 kg)    SpO2 100%    BMI 28.63 kg/m??     Physical Exam:   Physical Exam  Vitals reviewed.   Constitutional:       General: He is not in acute distress.     Appearance: Normal appearance. He is well-developed. He is not ill-appearing.   HENT:      Head: Normocephalic and atraumatic.      Right Ear: Tympanic membrane, ear canal and external ear normal.      Left Ear: Tympanic membrane, ear canal and external ear normal.      Nose: Rhinorrhea present. No congestion.      Mouth/Throat:      Mouth: No oral lesions.   Eyes:      General: No scleral icterus.     Conjunctiva/sclera: Conjunctivae normal.      Right eye: Right conjunctiva is not injected. No exudate.     Left eye: Left conjunctiva is not injected. No exudate.     Pupils: Pupils are equal, round, and reactive to light.  Neck:      Thyroid: No thyromegaly or thyroid tenderness.   Cardiovascular:      Rate and Rhythm: Normal rate and regular rhythm.      Heart sounds: No murmur heard.   No friction rub. No gallop.    Pulmonary:      Effort: Pulmonary effort is normal. No respiratory distress.      Breath sounds: No decreased breath sounds, wheezing, rhonchi or rales.   Abdominal:      General: Bowel sounds are normal.  There is no distension.      Palpations: Abdomen is soft. There is no mass.      Tenderness: There is no abdominal tenderness. There is no guarding or rebound.   Musculoskeletal:         General: No swelling or tenderness. Normal range of motion.      Cervical back: Neck supple. No tenderness. No muscular tenderness.      Right lower leg: No edema.      Left lower leg: No edema.   Lymphadenopathy:      Cervical: No cervical adenopathy.      Upper Body:      Right upper body: No supraclavicular or axillary adenopathy.      Left upper body: No supraclavicular or axillary adenopathy.   Skin:     General: Skin is warm and dry.      Coloration: Skin is not jaundiced.      Findings: No erythema or rash.      Nails: There is no clubbing.   Neurological:      General: No focal deficit present.      Mental Status: He is alert and oriented to person, place, and time.      Sensory: No sensory deficit.      Motor: No weakness.   Psychiatric:         Mood and Affect: Mood and affect normal.         Behavior: Behavior normal.         Thought Content: Thought content normal.         Judgment: Judgment normal.          Assessment & Plan:      Eric Miranda is a 24 y.o. male who presents in order to establish care for anxiety and allergies     Problem List Items Addressed This Visit        Respiratory    Seasonal allergic rhinitis     Recurrent around this time of year with rhinorrhea, post-nasal drip, and mild cough. He has no signs of infection at this time.    - Encouraged use of OTC measures such as antihistamines, nasal steroids, and sinus rinses as needed            Other    Anxiety - Primary     This has been a long-standing issue, with persistent generalized anxiety symptoms with graduation and trying to get a job. He has had some increased stress with trying to interview, with fair control with his current dose of sertraline. He has had no episodes of panic, and he is without insomnia, weight loss, or concomitant  depression. He has mild drowsiness and sexual side effects from the medication, but he is interested in trying an increased dose at this time.     - Will increase to Sertraline 125 mg daily  - Discussed CAM measures for stress/anxiety  Relevant Medications    sertraline (ZOLOFT) 100 MG tablet    sertraline (ZOLOFT) 25 MG tablet    Healthcare maintenance     Will obtain HIV, HCV, and STI testing         Relevant Orders    Hepatitis C Antibody (Completed)    HIV 1+2 Antibody/Antigen with Reflex (Completed)    Chlamydia / Gonorrhoeae DNA Urine (Completed)          Number and Complexity of Problems (Level 4)  During this encounter, I addressed 2 or more stable chronic illnesses.    Amount and/or Complexity of Data Ordered, Reviewed, or Analyzed (Level 4)  I ordered 3+ test(s) including:.    Risk of Complication and/or Morbidity or Mortality of Patient Management (Level  4)  The patient's management entails Moderate risk of complications and/or morbidity or mortality.    Overall LOS:  4        Return to Clinic in 6 months      Deeann Dowse, MD    Attending Physician - General Internal Medicine  02/14/2020 11:14 PM

## 2020-02-08 NOTE — Patient Instructions (Addendum)
Let's get you to the lab for blood/urine testing    I will send in the higher dose of the Sertraline at 125 mg daily

## 2020-02-14 DIAGNOSIS — F419 Anxiety disorder, unspecified: Secondary | ICD-10-CM

## 2020-02-14 NOTE — Telephone Encounter (Signed)
Patient advised

## 2020-02-14 NOTE — Assessment & Plan Note (Signed)
Recurrent around this time of year with rhinorrhea, post-nasal drip, and mild cough. He has no signs of infection at this time.    - Encouraged use of OTC measures such as antihistamines, nasal steroids, and sinus rinses as needed

## 2020-02-14 NOTE — Telephone Encounter (Signed)
L[facility] x1

## 2020-02-14 NOTE — Assessment & Plan Note (Signed)
Will obtain HIV, HCV, and STI testing

## 2020-02-14 NOTE — Telephone Encounter (Signed)
Can you please call Eric Miranda and let him know that his HIV, hepatitis C, and chlamydia/gonorrhea tests were all NEGATIVE. Thanks!

## 2020-02-14 NOTE — Assessment & Plan Note (Signed)
This has been a long-standing issue, with persistent generalized anxiety symptoms with graduation and trying to get a job. He has had some increased stress with trying to interview, with fair control with his current dose of sertraline. He has had no episodes of panic, and he is without insomnia, weight loss, or concomitant depression. He has mild drowsiness and sexual side effects from the medication, but he is interested in trying an increased dose at this time.     - Will increase to Sertraline 125 mg daily  - Discussed CAM measures for stress/anxiety

## 2020-03-23 MED ORDER — sertraline (ZOLOFT) 100 MG tablet
100 | ORAL_TABLET | Freq: Every day | ORAL | 2 refills | Status: AC
Start: 2020-03-23 — End: 2021-07-03

## 2020-03-23 MED ORDER — sertraline (ZOLOFT) 25 MG tablet
25 | ORAL_TABLET | Freq: Every day | ORAL | 2 refills | Status: AC
Start: 2020-03-23 — End: 2020-06-12

## 2020-03-23 NOTE — Telephone Encounter (Signed)
Pended.

## 2020-03-23 NOTE — Telephone Encounter (Signed)
Can you please call Larri about a medication refill? Somehow he had my cell number. Thanks!

## 2020-04-29 IMAGING — CR DG CHEST 2V
2 series · 2 of 2 positions shown · non-contrast
Comparison: None.

CLINICAL DATA: 21 y/o M; L woke with pain from the right ribs
through the right shoulder.

EXAM:
CHEST - 2 VIEW

[chest pa]
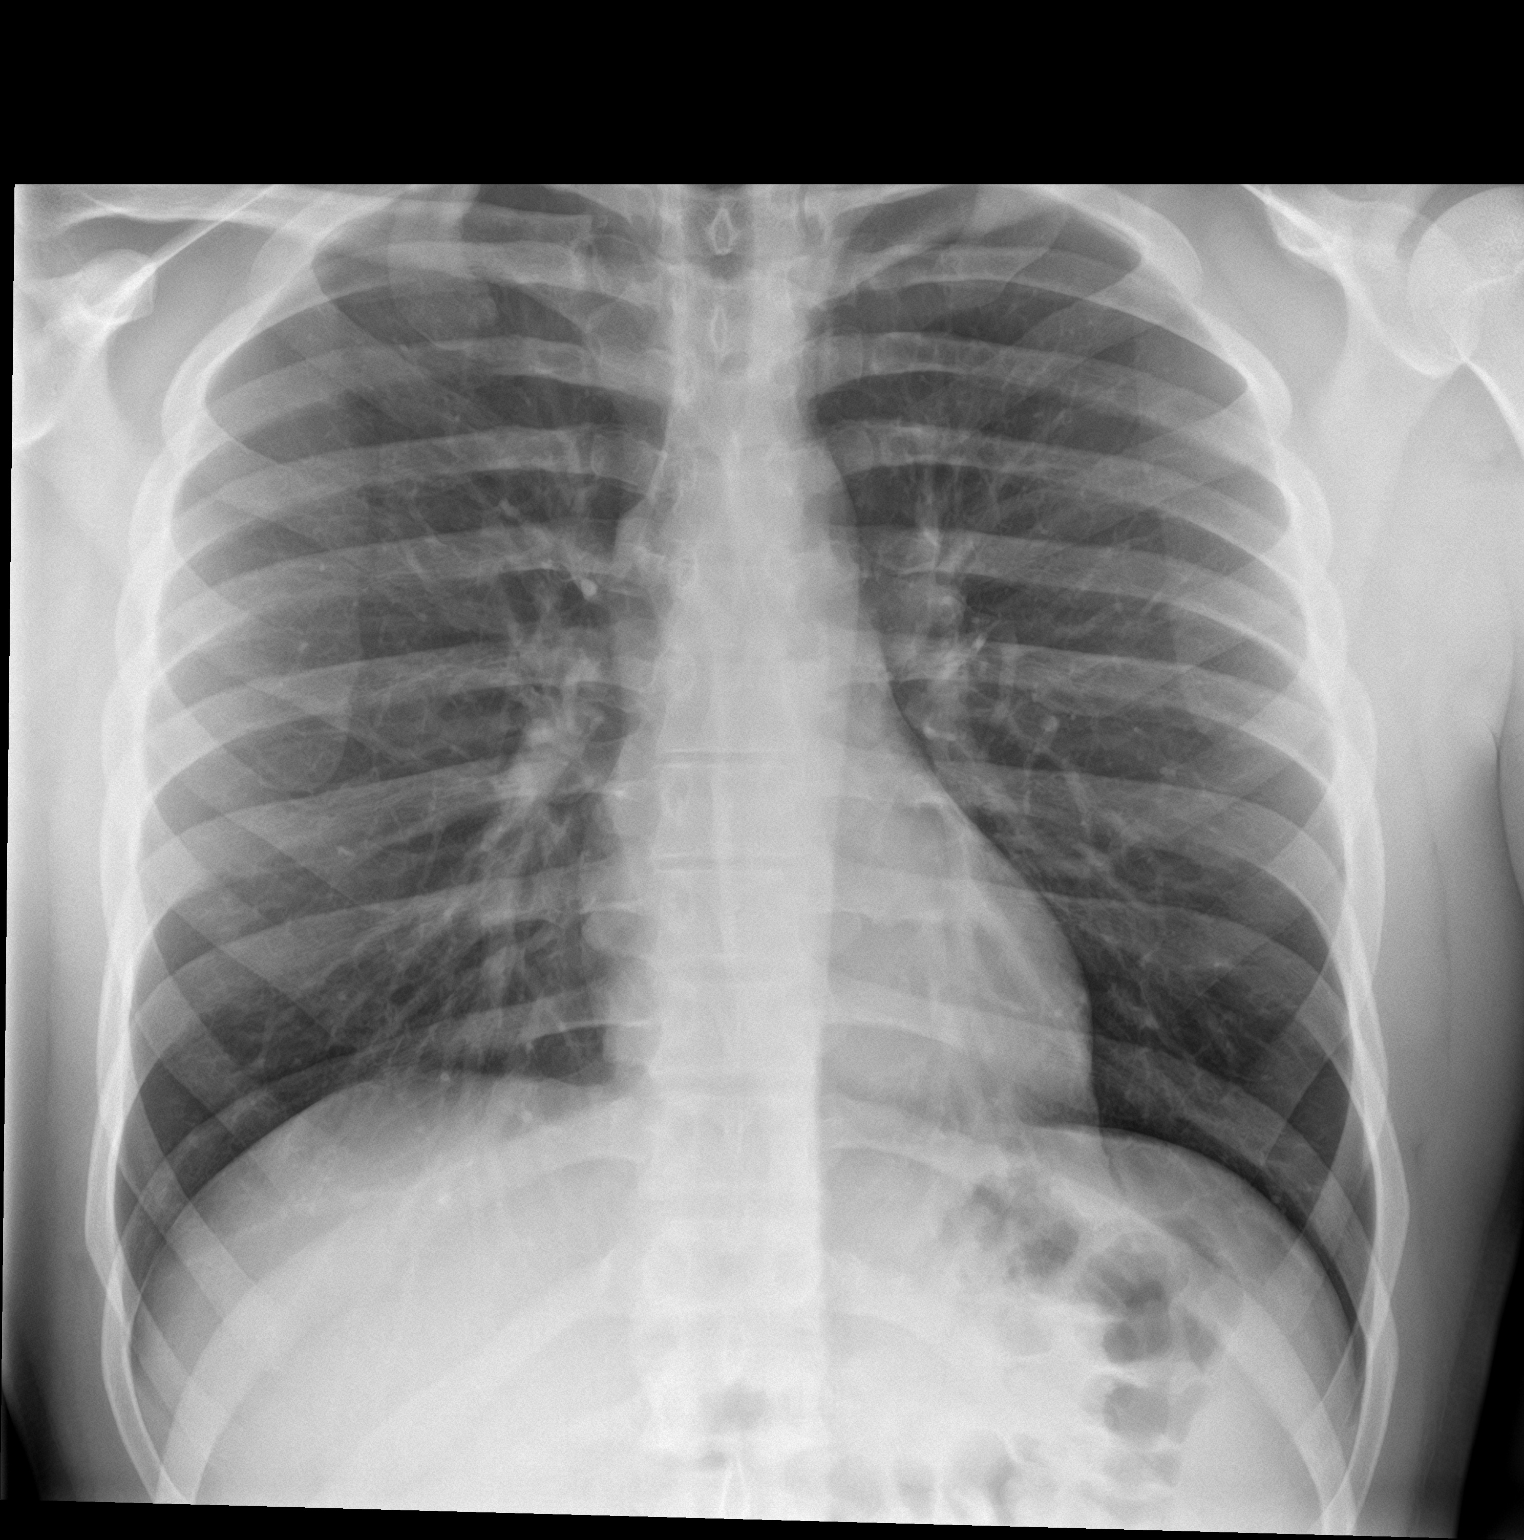

[chest lat]
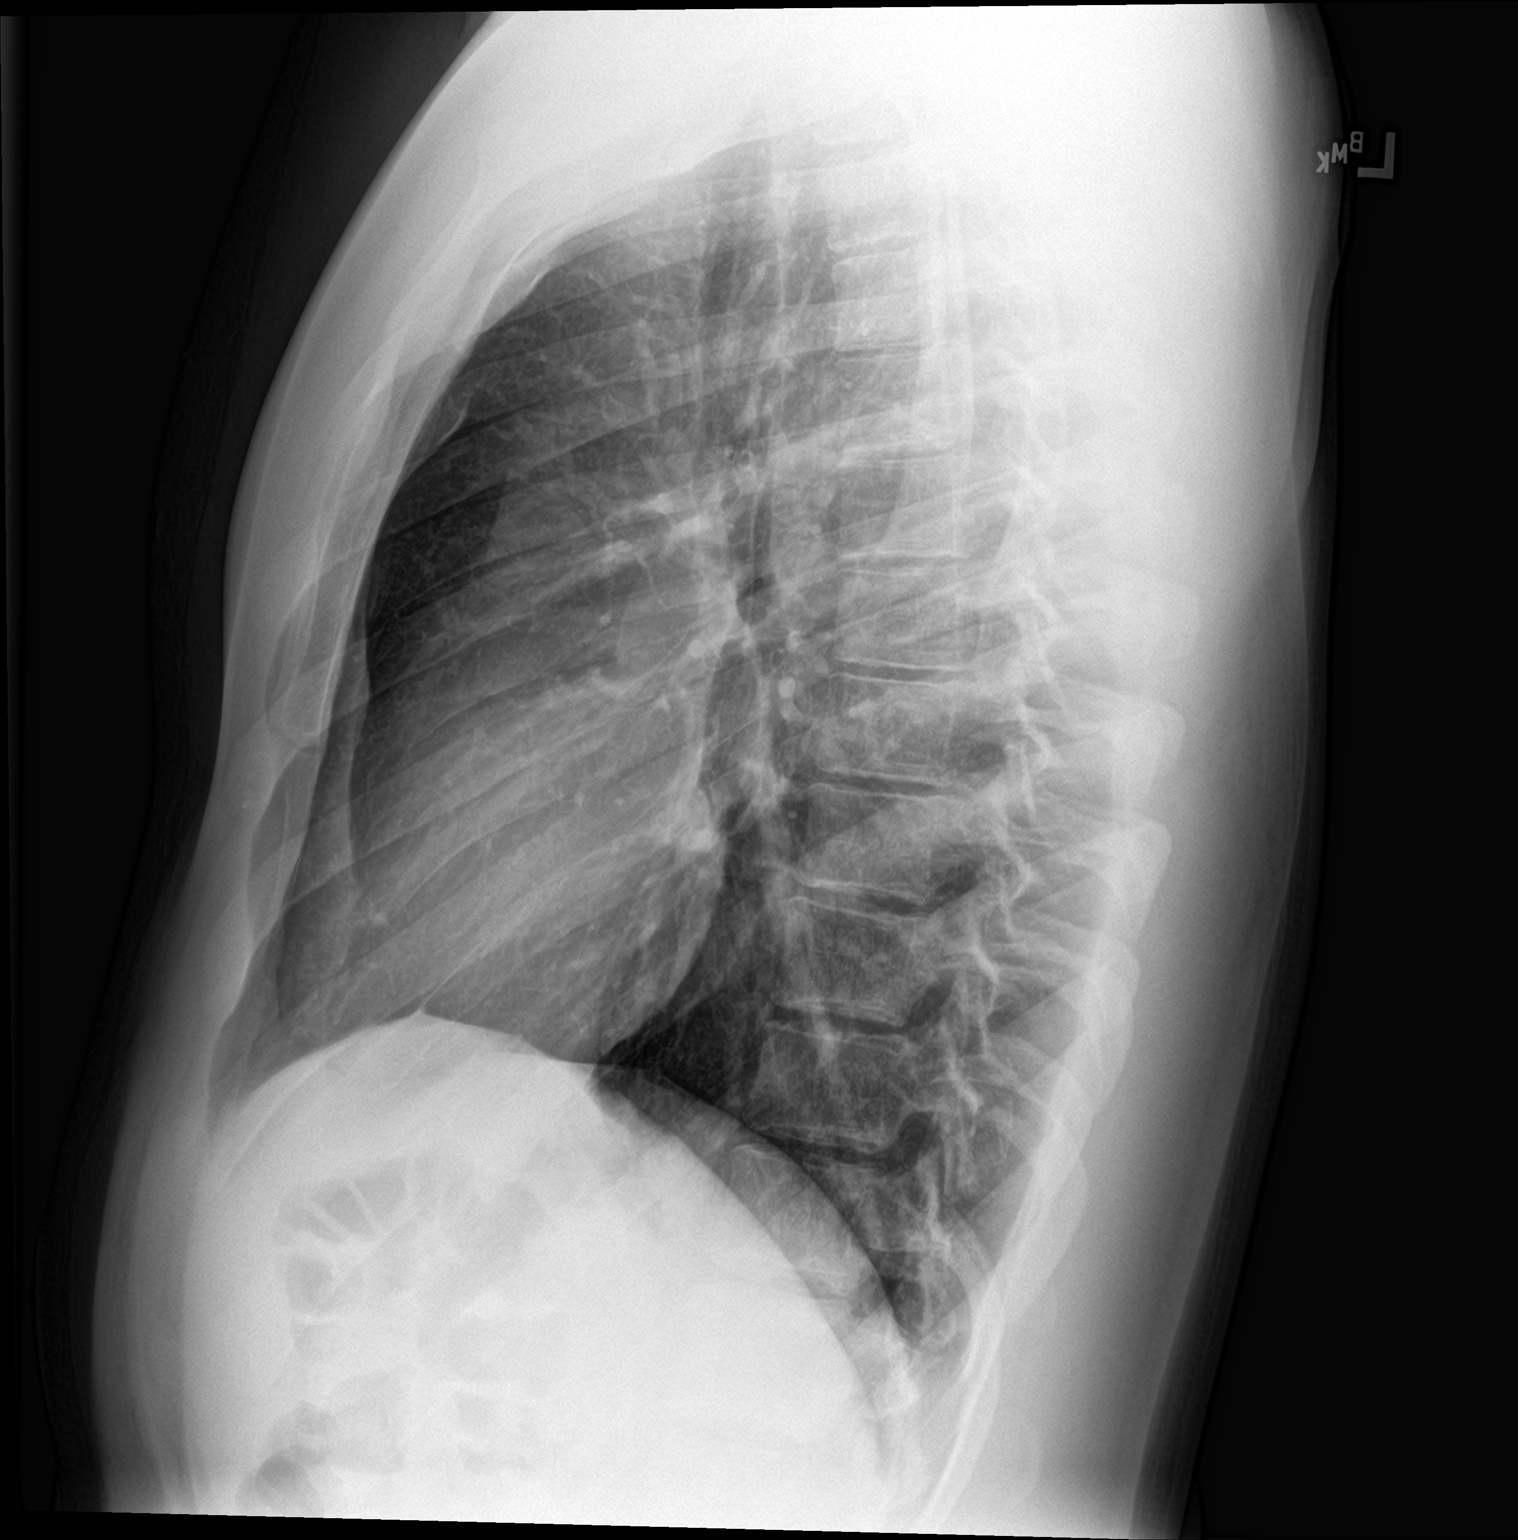

[2 of 2 positions shown; findings below may reference images not displayed]

FINDINGS: The heart size and mediastinal contours are within normal limits.
Both lungs are clear. The visualized skeletal structures are
unremarkable.
IMPRESSION: No active cardiopulmonary disease.

By: Saralena Qamishlo M.D.

## 2020-06-12 MED ORDER — sertraline (ZOLOFT) 25 MG tablet
25 | ORAL_TABLET | ORAL | 2 refills | Status: AC
Start: 2020-06-12 — End: 2021-07-03

## 2021-02-08 ENCOUNTER — Ambulatory Visit: Payer: PRIVATE HEALTH INSURANCE

## 2021-07-03 ENCOUNTER — Ambulatory Visit: Admit: 2021-07-03 | Discharge: 2021-07-03 | Payer: PRIVATE HEALTH INSURANCE

## 2021-07-03 DIAGNOSIS — F331 Major depressive disorder, recurrent, moderate: Secondary | ICD-10-CM

## 2021-07-03 MED ORDER — buPROPion XL (WELLBUTRIN XL) 150 MG 24 hr tablet
150 | ORAL_TABLET | Freq: Every day | ORAL | 2 refills | Status: AC
Start: 2021-07-03 — End: ?

## 2021-07-03 NOTE — Unmapped (Addendum)
For the depression and anxiety:  - Let's START Bupropion XL 150 mg daily  -- This will take 4-6 weeks to reach the full effect  -- If you want to increase the dosage after ~4 weeks, please let me know  - If this is causing side effects or issues along the way and you want to go back to the Sertraline, please let me know  - The [facility] can definitely help with sleep and other symptoms and is safe to use with the Bupropion

## 2021-07-03 NOTE — Unmapped (Signed)
Department of Internal Medicine   [facility]  Established Sick Visit      HPI / Subjective:     Eric Miranda is a 25 y.o. 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 Anxiety    ??? Depression    ??? Seasonal allergies        ROS:   Review of Systems   Constitutional: Negative for chills, fever, malaise/fatigue and weight loss.   Respiratory: Negative for cough, sputum production and shortness of breath.    Cardiovascular: Negative for chest pain, palpitations and leg swelling.   Gastrointestinal: Negative for abdominal pain, constipation, diarrhea, nausea and vomiting.   Skin: Negative for rash.        No erythema   Neurological: Negative for dizziness, weakness and headaches.   Psychiatric/Behavioral: Positive for depression. Negative for suicidal ideas. The patient is nervous/anxious. The patient does not have insomnia.         Positive for poor focus   All other systems reviewed and are negative.       Medications:     No current outpatient medications on file prior to visit.     No current facility-administered medications on file prior to visit.       Vital Signs:   BP 122/80    Pulse 82     Resp 12    Ht 6' 2 (1.88 m)    Wt 213 lb (96.6 kg)    SpO2 98%    BMI 27.35 kg/m??     Physical Exam:   Physical Exam  Vitals reviewed.   Constitutional:       General: He is not in acute distress.     Appearance: Normal appearance. He is well-developed. He is not ill-appearing.   HENT:      Head: Normocephalic and atraumatic.      Mouth/Throat:      Mouth: No oral lesions.   Eyes:      Conjunctiva/sclera:      Right eye: Right conjunctiva is not injected. No exudate.     Left eye: Left conjunctiva is not injected. No exudate.  Neck:      Thyroid: No thyroid tenderness.   Cardiovascular:      Rate and Rhythm: Normal rate and regular rhythm.      Heart sounds: No murmur heard.    No friction rub. No gallop.   Pulmonary:      Effort: Pulmonary effort is normal. No respiratory distress.  Breath sounds: No decreased breath sounds, wheezing, rhonchi or rales.   Abdominal:      General: Bowel sounds are normal. There is no distension.      Palpations: Abdomen is soft. There is no mass.      Tenderness: There is no abdominal tenderness. There is no guarding.   Musculoskeletal:      Cervical back: No muscular tenderness.   Skin:     General: Skin is warm and dry.      Findings: No erythema or rash.      Nails: There is no clubbing.   Neurological:      General: No focal deficit present.      Mental Status: He is alert and oriented to person, place, and time.   Psychiatric:         Mood and Affect: Mood and affect normal.         Behavior: Behavior normal.          Assessment & Plan:      Eric Miranda is a 25 y.o. 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  He reports that he has had longstanding issues with anxiety and depression, and he is last on medications in 2020.  He was only briefly on  sertraline at that time, but he does feel like it provided some relief overall.  He was doing well overall, but he has had increased stress at work and with his upcoming marriage.  He has had some issues with insomnia in the past, but he denies any recent issues with sleep, episodes of panic, or suicidal ideations.  He does have some issues with focus/attention, which could be related to anxiety or potentially mild ADD symptoms.  He does have: Anhedonia symptoms, and would likely benefit from being on a medication.    -With his potential concomitant ADD symptoms, we will treat with a course of bupropion XL 150 mg daily  --Discussed typical use and potential side effects  --Discussed potential temporary increase in anxiety/stress levels with initiation of this medication  -If no significant effects in 2 to 4 weeks, we can increase to 300 mg daily  -If he has significant side effects or increases in his anxiety symptoms, we can transition to sertraline  -Discussed behavioral interventions and counseling for anxiety and depression symptoms         Relevant Medications    buPROPion XL (WELLBUTRIN XL) 150 MG 24 hr tablet          Return to Clinic prn and as scheduled  Future Appointments   Date Time Provider Department Center   10/02/2021  1:40 PM Deeann Dowse, MD [facility]        Total time I spent for E/M services on the date of the encounter: 20 minutes      Deeann Dowse, MD    Attending Physician - General Internal Medicine  07/09/2021 9:35 PM

## 2021-07-09 DIAGNOSIS — F331 Major depressive disorder, recurrent, moderate: Secondary | ICD-10-CM

## 2021-07-09 NOTE — Unmapped (Signed)
Patient presents today for symptoms of primarily depression, with some anxiety.  He reports that he has had longstanding issues with anxiety and depression, and he is last on medications in 2020.  He was only briefly on sertraline at that time, but he does feel like it provided some relief overall.  He was doing well overall, but he has had increased stress at work and with his upcoming marriage.  He has had some issues with insomnia in the past, but he denies any recent issues with sleep, episodes of panic, or suicidal ideations.  He does have some issues with focus/attention, which could be related to anxiety or potentially mild ADD symptoms.  He does have: Anhedonia symptoms, and would likely benefit from being on a medication.    -With his potential concomitant ADD symptoms, we will treat with a course of bupropion XL 150 mg daily  --Discussed typical use and potential side effects  --Discussed potential temporary increase in anxiety/stress levels with initiation of this medication  -If no significant effects in 2 to 4 weeks, we can increase to 300 mg daily  -If he has significant side effects or increases in his anxiety symptoms, we can transition to sertraline  -Discussed behavioral interventions and counseling for anxiety and depression symptoms

## 2021-07-13 IMAGING — CR DG CHEST 2V
1 series · 2 of 2 positions shown · non-contrast
Comparison: 05/07/2018

CLINICAL DATA: Pt states he has cough,SOB mid to left side chest
pain and fatigue since [REDACTED]. Pt tested positive for COVID in
[REDACTED]. No prior cardiac history. shielded

EXAM:
CHEST - 2 VIEW

[Series 1: dg chest 2 view · 0.14mm/px · 2 of 2 slices shown]
[im 1/2]
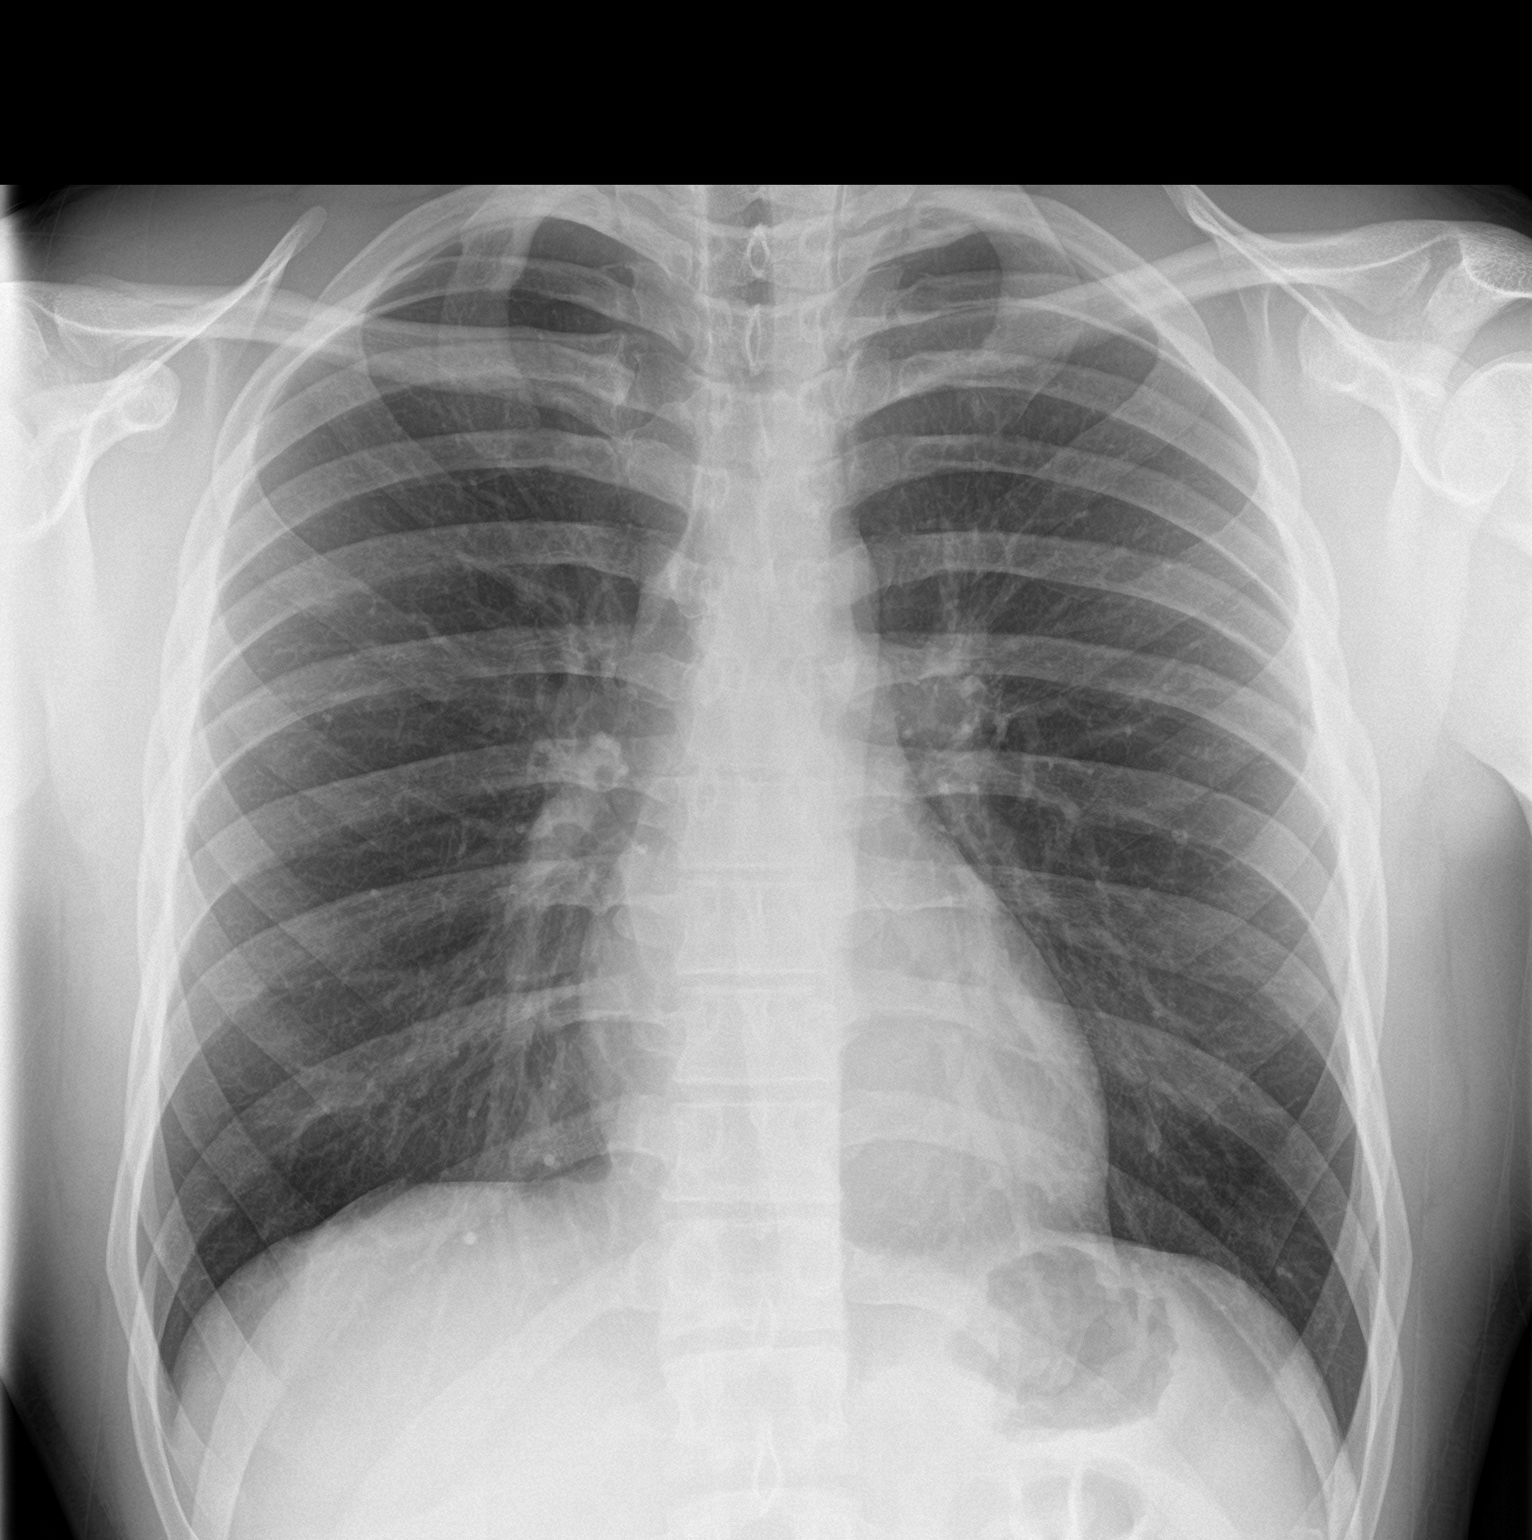
[im 2/2]
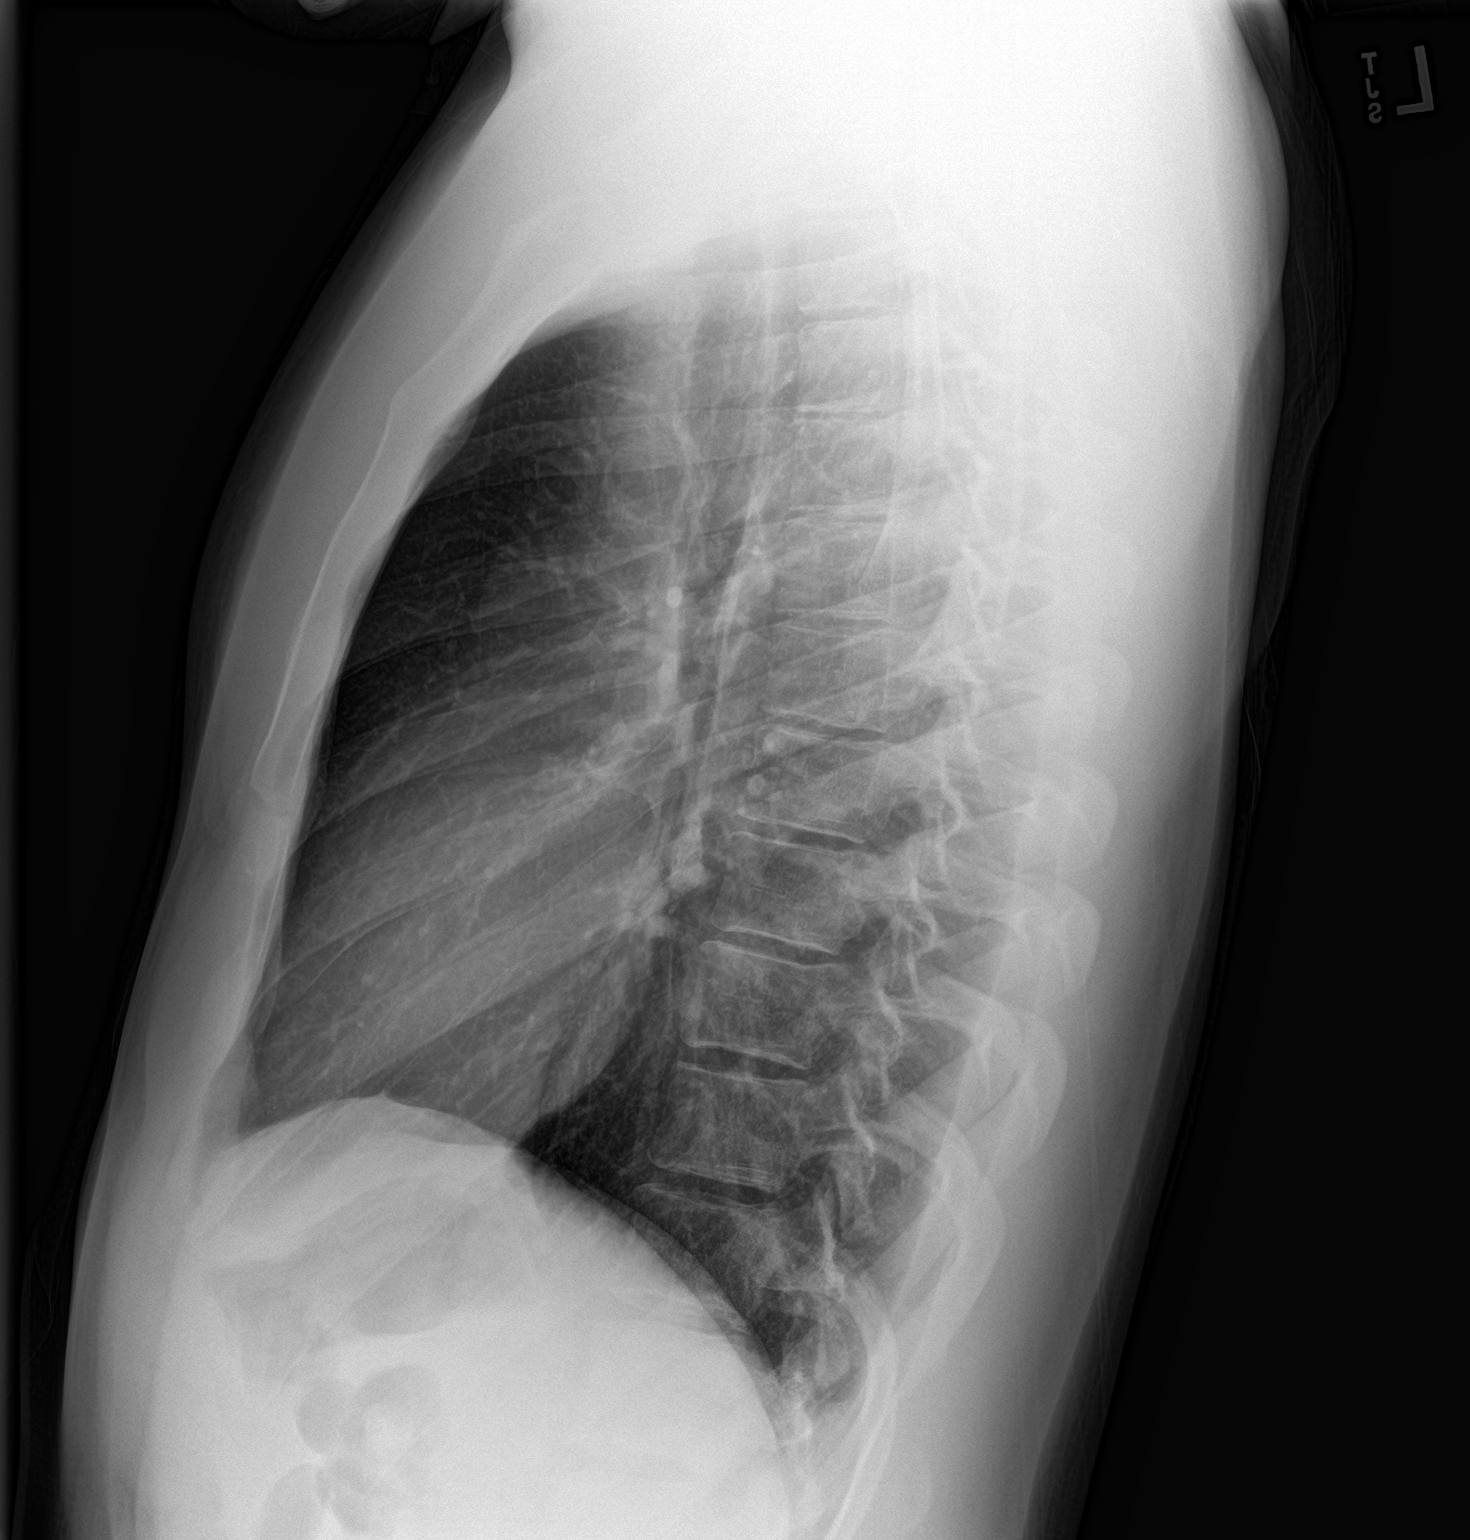

[2 of 2 positions shown; findings below may reference images not displayed]

FINDINGS: The heart size and mediastinal contours are within normal limits.
Both lungs are clear. The visualized skeletal structures are
unremarkable.
IMPRESSION: No active cardiopulmonary disease.

## 2021-10-02 ENCOUNTER — Ambulatory Visit: Payer: PRIVATE HEALTH INSURANCE
